# Patient Record
Sex: Female | Born: 1960 | Race: White | Hispanic: No | Marital: Married | Smoking: Current every day smoker
Health system: Southern US, Community
[De-identification: ages and names within clinical notes are randomized; demographics above are authoritative.]

## PROBLEM LIST (undated history)

## (undated) DIAGNOSIS — I10 Essential (primary) hypertension: Secondary | ICD-10-CM

## (undated) HISTORY — PX: BREAST SURGERY: SHX581

---

## 2007-02-16 ENCOUNTER — Ambulatory Visit: Payer: Self-pay | Admitting: Family Medicine

## 2014-11-04 ENCOUNTER — Emergency Department (HOSPITAL_COMMUNITY): Payer: Self-pay

## 2014-11-04 ENCOUNTER — Encounter (HOSPITAL_COMMUNITY): Payer: Self-pay | Admitting: Emergency Medicine

## 2014-11-04 ENCOUNTER — Emergency Department (HOSPITAL_COMMUNITY)
Admission: EM | Admit: 2014-11-04 | Discharge: 2014-11-04 | Disposition: A | Discharge status: Home / Self Care | Payer: Self-pay | Attending: Emergency Medicine | Admitting: Emergency Medicine

## 2014-11-04 DIAGNOSIS — Z720 Tobacco use: Secondary | ICD-10-CM | POA: Insufficient documentation

## 2014-11-04 DIAGNOSIS — S93401A Sprain of unspecified ligament of right ankle, initial encounter: Secondary | ICD-10-CM | POA: Insufficient documentation

## 2014-11-04 DIAGNOSIS — Y998 Other external cause status: Secondary | ICD-10-CM | POA: Insufficient documentation

## 2014-11-04 DIAGNOSIS — Y9289 Other specified places as the place of occurrence of the external cause: Secondary | ICD-10-CM | POA: Insufficient documentation

## 2014-11-04 DIAGNOSIS — Y9389 Activity, other specified: Secondary | ICD-10-CM | POA: Insufficient documentation

## 2014-11-04 DIAGNOSIS — W010XXA Fall on same level from slipping, tripping and stumbling without subsequent striking against object, initial encounter: Secondary | ICD-10-CM | POA: Insufficient documentation

## 2014-11-04 DIAGNOSIS — I10 Essential (primary) hypertension: Secondary | ICD-10-CM | POA: Insufficient documentation

## 2014-11-04 HISTORY — DX: Essential (primary) hypertension: I10

## 2014-11-04 MED ORDER — HYDROCODONE-ACETAMINOPHEN 5-325 MG PO TABS
2.0000 | ORAL_TABLET | Freq: Once | ORAL | Status: AC
  Administered 2014-11-04: 2 via ORAL
  Filled 2014-11-04: qty 2

## 2014-11-04 MED ORDER — HYDROCODONE-ACETAMINOPHEN 5-325 MG PO TABS: 1.0000 | ORAL_TABLET | ORAL | Status: DC | PRN

## 2014-11-04 MED ORDER — IBUPROFEN 800 MG PO TABS: 800.0000 mg | ORAL_TABLET | Freq: Three times a day (TID) | ORAL | Status: DC

## 2014-11-04 NOTE — ED Notes (Signed)
Pt reports tripped down three stairs chasing after her cat. Pt reports right ankle and foot pain ever since. No obvious deformity noted. nad noted.

## 2014-11-04 NOTE — ED Provider Notes (Signed)
CSN: [id number]     Arrival date & time 11/04/14  1406 History   This chart was scribed for non-physician practitioner, Ivery QualeBryant Blessings Inglett, PA-C working with Raeford RazorStephen Kohut, MD by Luisa DagoPriscilla Tutu, ED scribe. This patient was seen in room APFT24/APFT24 and the patient's care was started at 4:39 PM.    Chief Complaint  Patient presents with  . Ankle Pain   The history is provided by the patient. No language interpreter was used.   HPI Comments: Deborah Hebert is a 53 y.o. female with PMhx of HTN presents to the Emergency Department complaining of right ankle pain with onset last night at [facility]7PM. Pt states that she tripped down 3 stairs chasing after her cat. She thinks that she may have twisted it but she is not sure. She reports taking Ibuprofen with no significant relief. Denies any fever, chills, nausea, numbness, weakness, wounds, taking any blood thinner, or hx of blood disorders.   Past Medical History  Diagnosis Date  . Hypertension    Past Surgical History  Procedure Laterality Date  . Breast surgery     History reviewed. No pertinent family history. History  Substance Use Topics  . Smoking status: Current Every Day Smoker -- 1.00 packs/day  . Smokeless tobacco: Not on file  . Alcohol Use: No   OB History    No data available     Review of Systems  Constitutional: Negative for fever and chills.  Musculoskeletal: Positive for joint swelling and arthralgias. Negative for myalgias.  Neurological: Negative for weakness and numbness.  All other systems reviewed and are negative.  Allergies  Review of patient's allergies indicates not on file.  Home Medications   Prior to Admission medications   Not on File   Triage vitals:BP 139/91 mmHg  Pulse 102  Temp(Src) 98.6 F (37 C) (Oral)  Resp 16  Ht 5\' 1"  (1.549 m)  Wt 126 lb (57.153 kg)  BMI 23.82 kg/m2  SpO2 100%  Physical Exam  Constitutional: She is oriented to person, place, and time. She appears well-developed and  well-nourished. No distress.  HENT:  Head: Normocephalic and atraumatic.  Eyes: Conjunctivae and EOM are normal. Right eye exhibits no discharge. Left eye exhibits no discharge.  Neck: Neck supple.  Cardiovascular: Normal rate, regular rhythm and normal heart sounds.  Exam reveals no gallop and no friction rub.   No murmur heard. Pulmonary/Chest: Effort normal and breath sounds normal. No respiratory distress. She has no wheezes. She has no rales. She exhibits no tenderness.  Symmetrical rise and fall of the chest   Abdominal: Soft. She exhibits no distension. There is no tenderness.  Musculoskeletal: Normal range of motion. She exhibits no edema or tenderness.  No deformity of the tibia area. Pain and moderate swelling of the lateral malleolus on the right.  Mild crepitus to the right knee but with FROM. FROM of the right hip.  Neurological: She is alert and oriented to person, place, and time.  No sensory deficits of the right lower extremity. No motor deficits.   Skin: Skin is warm and dry.  Moderate bruise noted lateral to the medial malleolus. No puncture wound to the sole of the right foot.  Psychiatric: She has a normal mood and affect. Her behavior is normal. Thought content normal.  Nursing note and vitals reviewed.   ED Course  Procedures (including critical care time)  DIAGNOSTIC STUDIES: Oxygen Saturation is 100% on RA, normal by my interpretation.    COORDINATION OF CARE: 4:46  PM- pt's imaging was reviewed and discussed with her. Advised pt to keep the foot elevated and will discharge pt home with some pain medication and crutches for comfort. Pt advised of plan for treatment and pt agrees.  Imaging Review Dg Ankle Complete Right  11/04/2014   CLINICAL DATA:  53 year old female with right ankle and foot pain and swelling after falling off a porch last night.  EXAM: RIGHT ANKLE - COMPLETE 3+ VIEW  COMPARISON:  Concurrently obtained radiographs of the right foot  FINDINGS:  Soft tissue swelling overlies the lateral malleolus. There is no evidence of acute fracture or malalignment. No ankle joint effusion. Bony mineralization is within normal limits. No lytic or blastic osseous lesion.  IMPRESSION: Soft tissue swelling over the lateral malleolus without evidence of underlying fracture or malalignment.   Electronically Signed   By: Malachy MoanHeath  McCullough M.D.   On: 11/04/2014 15:25   Dg Foot Complete Right  11/04/2014   CLINICAL DATA:  53 year old female with right ankle and foot pain after falling off reports last night.  EXAM: RIGHT FOOT COMPLETE - 3+ VIEW  COMPARISON:  Concurrently obtained radiographs of the ankle.  FINDINGS: There is no evidence of fracture or dislocation. There is no evidence of arthropathy or other focal bone abnormality. Soft tissues are unremarkable.  IMPRESSION: Negative.   Electronically Signed   By: Malachy MoanHeath  McCullough M.D.   On: 11/04/2014 15:26    MDM  xrays are negative for fx or dislocation. Exam suggest ankle sprain.  Plan - ortho follow up. norco and ibuprofen Rx given.   Final diagnoses:  Ankle sprain, right, initial encounter    **I have reviewed nursing notes, vital signs, and all appropriate lab and imaging results for this patient.*  *I personally performed the services described in this documentation, which was scribed in my presence. The recorded information has been reviewed and is accurate.   Kathie DikeHobson M Phaedra Colgate, PA-C 11/08/14 1359  Raeford RazorStephen Kohut, MD 11/11/14 [phone]

## 2014-11-04 NOTE — Discharge Instructions (Signed)
Please keep your ankle elevated above your waist. Apply ice. Use crutches until you can safely apply weight. Use ibuprofen three times daily. Use norco every 4 hours if needed for pain. Follow up with your primary MD. Ankle Sprain An ankle sprain is an injury to the strong, fibrous tissues (ligaments) that hold the bones of your ankle joint together.  CAUSES An ankle sprain is usually caused by a fall or by twisting your ankle. Ankle sprains most commonly occur when you step on the outer edge of your foot, and your ankle turns inward. People who participate in sports are more prone to these types of injuries.  SYMPTOMS   Pain in your ankle. The pain may be present at rest or only when you are trying to stand or walk.  Swelling.  Bruising. Bruising may develop immediately or within 1 to 2 days after your injury.  Difficulty standing or walking, particularly when turning corners or changing directions. DIAGNOSIS  Your caregiver will ask you details about your injury and perform a physical exam of your ankle to determine if you have an ankle sprain. During the physical exam, your caregiver will press on and apply pressure to specific areas of your foot and ankle. Your caregiver will try to move your ankle in certain ways. An X-ray exam may be done to be sure a bone was not broken or a ligament did not separate from one of the bones in your ankle (avulsion fracture).  TREATMENT  Certain types of braces can help stabilize your ankle. Your caregiver can make a recommendation for this. Your caregiver may recommend the use of medicine for pain. If your sprain is severe, your caregiver may refer you to a surgeon who helps to restore function to parts of your skeletal system (orthopedist) or a physical therapist. HOME CARE INSTRUCTIONS   Apply ice to your injury for 1-2 days or as directed by your caregiver. Applying ice helps to reduce inflammation and pain.  Put ice in a plastic bag.  Place a towel  between your skin and the bag.  Leave the ice on for 15-20 minutes at a time, every 2 hours while you are awake.  Only take over-the-counter or prescription medicines for pain, discomfort, or fever as directed by your caregiver.  Elevate your injured ankle above the level of your heart as much as possible for 2-3 days.  If your caregiver recommends crutches, use them as instructed. Gradually put weight on the affected ankle. Continue to use crutches or a cane until you can walk without feeling pain in your ankle.  If you have a plaster splint, wear the splint as directed by your caregiver. Do not rest it on anything harder than a pillow for the first 24 hours. Do not put weight on it. Do not get it wet. You may take it off to take a shower or bath.  You may have been given an elastic bandage to wear around your ankle to provide support. If the elastic bandage is too tight (you have numbness or tingling in your foot or your foot becomes cold and blue), adjust the bandage to make it comfortable.  If you have an air splint, you may blow more air into it or let air out to make it more comfortable. You may take your splint off at night and before taking a shower or bath. Wiggle your toes in the splint several times per day to decrease swelling. SEEK MEDICAL CARE IF:   You have  rapidly increasing bruising or swelling.  Your toes feel extremely cold or you lose feeling in your foot.  Your pain is not relieved with medicine. SEEK IMMEDIATE MEDICAL CARE IF:  Your toes are numb or blue.  You have severe pain that is increasing. MAKE SURE YOU:   Understand these instructions.  Will watch your condition.  Will get help right away if you are not doing well or get worse. Document Released: 10/25/2005 Document Revised: 07/19/2012 Document Reviewed: 11/06/2011 [facility] Patient Information 2015 [place], [place]. This information is not intended to replace advice given to you by your health care  provider. Make sure you discuss any questions you have with your health care provider.

## 2015-09-07 ENCOUNTER — Ambulatory Visit (INDEPENDENT_AMBULATORY_CARE_PROVIDER_SITE_OTHER): Payer: Self-pay

## 2015-09-07 ENCOUNTER — Emergency Department
Admission: EM | Admit: 2015-09-07 | Discharge: 2015-09-07 | Disposition: A | Discharge status: Home / Self Care | Payer: Self-pay | Attending: Emergency Medicine | Admitting: Emergency Medicine

## 2015-09-07 ENCOUNTER — Ambulatory Visit
Admission: EM | Admit: 2015-09-07 | Discharge: 2015-09-07 | Disposition: A | Discharge status: Other Acute Inpatient Hospital | Payer: Self-pay | Attending: Family Medicine | Admitting: Family Medicine

## 2015-09-07 ENCOUNTER — Encounter: Payer: Self-pay | Admitting: Emergency Medicine

## 2015-09-07 ENCOUNTER — Emergency Department: Payer: Self-pay

## 2015-09-07 DIAGNOSIS — Y9289 Other specified places as the place of occurrence of the external cause: Secondary | ICD-10-CM | POA: Insufficient documentation

## 2015-09-07 DIAGNOSIS — W07XXXA Fall from chair, initial encounter: Secondary | ICD-10-CM | POA: Insufficient documentation

## 2015-09-07 DIAGNOSIS — S52502A Unspecified fracture of the lower end of left radius, initial encounter for closed fracture: Secondary | ICD-10-CM

## 2015-09-07 DIAGNOSIS — Z791 Long term (current) use of non-steroidal anti-inflammatories (NSAID): Secondary | ICD-10-CM | POA: Insufficient documentation

## 2015-09-07 DIAGNOSIS — Z720 Tobacco use: Secondary | ICD-10-CM | POA: Insufficient documentation

## 2015-09-07 DIAGNOSIS — Z79899 Other long term (current) drug therapy: Secondary | ICD-10-CM | POA: Insufficient documentation

## 2015-09-07 DIAGNOSIS — Y998 Other external cause status: Secondary | ICD-10-CM | POA: Insufficient documentation

## 2015-09-07 DIAGNOSIS — I10 Essential (primary) hypertension: Secondary | ICD-10-CM | POA: Insufficient documentation

## 2015-09-07 DIAGNOSIS — S52532A Colles' fracture of left radius, initial encounter for closed fracture: Secondary | ICD-10-CM | POA: Insufficient documentation

## 2015-09-07 DIAGNOSIS — Y9389 Activity, other specified: Secondary | ICD-10-CM | POA: Insufficient documentation

## 2015-09-07 MED ORDER — DOCUSATE SODIUM 100 MG PO CAPS: ORAL_CAPSULE | ORAL | Status: DC

## 2015-09-07 MED ORDER — HYDROCODONE-ACETAMINOPHEN 5-325 MG PO TABS: 1.0000 | ORAL_TABLET | Freq: Four times a day (QID) | ORAL | Status: DC | PRN

## 2015-09-07 MED ORDER — LIDOCAINE HCL (PF) 1 % IJ SOLN
INTRAMUSCULAR | Status: AC
  Administered 2015-09-07: 16:00:00
  Filled 2015-09-07: qty 5

## 2015-09-07 MED ORDER — ONDANSETRON HCL 4 MG/2ML IJ SOLN
4.0000 mg | INTRAMUSCULAR | Status: AC
  Administered 2015-09-07: 4 mg via INTRAVENOUS
  Filled 2015-09-07: qty 2

## 2015-09-07 MED ORDER — KETOROLAC TROMETHAMINE 60 MG/2ML IM SOLN
60.0000 mg | Freq: Once | INTRAMUSCULAR | Status: AC
  Administered 2015-09-07: 60 mg via INTRAMUSCULAR

## 2015-09-07 MED ORDER — MORPHINE SULFATE (PF) 4 MG/ML IV SOLN
4.0000 mg | Freq: Once | INTRAVENOUS | Status: AC
  Administered 2015-09-07: 4 mg via INTRAVENOUS
  Filled 2015-09-07: qty 1

## 2015-09-07 MED ORDER — HYDROCODONE-ACETAMINOPHEN 5-325 MG PO TABS
1.0000 | ORAL_TABLET | Freq: Once | ORAL | Status: AC
  Administered 2015-09-07: 1 via ORAL

## 2015-09-07 MED ORDER — BUPIVACAINE HCL (PF) 0.5 % IJ SOLN
INTRAMUSCULAR | Status: AC
  Administered 2015-09-07: 16:00:00
  Filled 2015-09-07: qty 30

## 2015-09-07 NOTE — ED Provider Notes (Signed)
[facility] Emergency Department Provider Note  ____________________________________________  Time seen: Approximately 1:48 PM  I have reviewed the triage vital signs and the nursing notes.   HISTORY  Chief Complaint Wrist Pain    HPI Stashia Sia is a 54 y.o. female with a past medical history that includes hypertension and tobacco abuse who presents with pain and deformity in her left wrist.  She was sent over by the medical in urgent care for further evaluation given the severity of her fracture.  She had a mechanical fall last night where she fell off of a chair and sustained the full brunt of the fall on her left wrist.  She did not strike her head or lose consciousness,and she denies any head or neck pain.  She did not have a ride so she "tough it out" through the night and then went to the [facility] urgent care this morning where she had x-rays and was referred here.  She has not had any food or drink since about 8:00 this morning (approximately 6 hours ago).  She has sustained no other injuries.  She reports that the pain in her wrist is moderate at rest and severe with any sort of movement.  She denies numbness or tingling distal to the wound.  She is able to minimally wiggle her fingers but the movement does cause pain.   Past Medical History  Diagnosis Date  . Hypertension     There are no active problems to display for this patient.   Past Surgical History  Procedure Laterality Date  . Breast surgery      Current Outpatient Rx  Name  Route  Sig  Dispense  Refill  . Aspirin-Acetaminophen-Caffeine (GOODY HEADACHE PO)   Oral   Take 1 packet by mouth daily as needed (for pain).         Marland Kitchen docusate sodium (COLACE) 100 MG capsule      Take 1 tablet once or twice daily as needed for constipation while taking narcotic pain medicine   30 capsule   1   . hydrochlorothiazide (HYDRODIURIL) 12.5 MG tablet   Oral   Take 12.5 mg by mouth every  morning.         Marland Kitchen HYDROcodone-acetaminophen (NORCO/VICODIN) 5-325 MG tablet   Oral   Take 1-2 tablets by mouth every 6 (six) hours as needed for moderate pain.   40 tablet   0   . ibuprofen (ADVIL,MOTRIN) 800 MG tablet   Oral   Take 1 tablet (800 mg total) by mouth 3 (three) times daily.   21 tablet   0     Allergies Review of patient's allergies indicates no known allergies.  No family history on file.  Social History Social History  Substance Use Topics  . Smoking status: Current Every Day Smoker -- 1.00 packs/day  . Smokeless tobacco: None  . Alcohol Use: No    Review of Systems Constitutional: No fever/chills Cardiovascular: Denies chest pain. Respiratory: Denies shortness of breath. Gastrointestinal: No abdominal pain.  No nausea, no vomiting.  No diarrhea.  No constipation. Musculoskeletal: Moderate to severe pain with deformity and left wrist Skin: Negative for lacerations Neurological: Negative for headaches, focal weakness or numbness.   ____________________________________________   PHYSICAL EXAM:  VITAL SIGNS: ED Triage Vitals  Enc Vitals Group     BP 09/07/15 1209 176/106 mmHg     Pulse Rate 09/07/15 1209 67     Resp 09/07/15 1209 20  Temp 09/07/15 1209 98.5 F (36.9 C)     Temp src --      SpO2 09/07/15 1209 97 %     Weight 09/07/15 1209 147 lb (66.679 kg)     Height 09/07/15 1209 5' (1.524 m)     Head Cir --      Peak Flow --      Pain Score 09/07/15 1206 4     Pain Loc --      Pain Edu? --      Excl. in GC? --     Constitutional: Alert and oriented. Well appearing and in no acute distress. Eyes: Conjunctivae are normal. PERRL. EOMI. Head: Atraumatic. Mouth/Throat: Mucous membranes are moist.  Oropharynx non-erythematous. Cardiovascular: Normal rate, regular rhythm. Good peripheral circulation. Respiratory: Normal respiratory effort.  No retractions. Mild expiratory wheezing. Gastrointestinal: Soft and nontender.   Musculoskeletal: Deformity to left wrist consistent with a Colles' fracture.  Severe tenderness to palpation.  Able to move fingers.  Neurovascularly intact distal to injury. Neurologic:  Normal speech and language. No gross focal neurologic deficits are appreciated.  Skin:  Skin is warm, dry and intact. No rash noted. Psychiatric: Mood and affect are normal. Speech and behavior are normal.  ____________________________________________   LABS (all labs ordered are listed, but only abnormal results are displayed)  Labs Reviewed - No data to display ____________________________________________  EKG  Not indicated ____________________________________________  RADIOLOGY   Dg Wrist Complete Left  09/07/2015  CLINICAL DATA:  Post reduction left wrist fracture. EXAM: LEFT WRIST - COMPLETE 3+ VIEW COMPARISON:  Left wrist plain films from earlier same day. FINDINGS: There is significantly improved alignment at the distal radius fracture site status post reduction and casting. Ulnar-styloid process fracture again noted. IMPRESSION: Improved alignment status post reduction and casting. Electronically Signed   By: Bary Richard M.D.   On: 09/07/2015 16:19   Dg Wrist Complete Left  09/07/2015  CLINICAL DATA:  Fall from chair, pain and swelling left radial side of wrist. EXAM: LEFT WRIST - COMPLETE 3+ VIEW COMPARISON:  None. FINDINGS: There is a displaced fracture of the distal left radius, Colles' fracture deformity, with loss of the normal volar relationship at the radiocarpal joint space. At least some degree of impaction at the fracture site. Additional irregularity within the distal left humerus compatible with an old ulnar styloid fracture. Carpal bones appear intact and well aligned throughout. Metacarpal bones appear intact and well aligned. IMPRESSION: Displaced fracture of the distal left radius, Colles' fracture, with associated dorsal angulation deformity and probable impaction.  Electronically Signed   By: Bary Richard M.D.   On: 09/07/2015 10:56   Dg Hand Complete Left  09/07/2015  CLINICAL DATA:  Fall, wrist pain/swelling EXAM: LEFT HAND - COMPLETE 3+ VIEW COMPARISON:  None. FINDINGS: Comminuted, impacted, intra-articular distal radial fracture. Apex volar angulation. Nondisplaced ulnar styloid fracture. Associated soft tissue swelling. IMPRESSION: Impacted distal radial fracture, as above. Ulnar styloid fracture. Electronically Signed   By: Charline Bills M.D.   On: 09/07/2015 10:53    ____________________________________________   PROCEDURES  Procedure(s) performed: None  Critical Care performed: No ____________________________________________   INITIAL IMPRESSION / ASSESSMENT AND PLAN / ED COURSE  Pertinent labs & imaging results that were available during my care of the patient were reviewed by me and considered in my medical decision making (see chart for details).  I spoke by phone with Dr. Joice Lofts who reviewed the images and states that the patient does need some reduction of  the fracture.  He is coming to the emergency department to personally evaluate and reduce the patient's injury.  I will place a peripheral IV, ask the appropriate reduction/casting materials to be put into the room, and provided morphine and Zofran.  Current plan is for Dr. Joice LoftsPoggi to perform a hematoma block and reduce it without procedural sedation, but we will escalate to that if necessary.  ----------------------------------------- 4:21 PM on 09/07/2015 -----------------------------------------  Dr. Joice LoftsPoggi came to the emergency department, performed the reduction and splinting himself, and discussed the case with him afterwards.  I also reassessed the patient's neurovascular status after placement of stents and everything appears well.  As per his recommendation I provided a prescription for pain medication and I also added a prescription for Colace.  I gave her my usual and  customary return precautions.  She will follow up with Dr. Joice LoftsPoggi in clinic in 3 days.  ____________________________________________  FINAL CLINICAL IMPRESSION(S) / ED DIAGNOSES  Final diagnoses:  Closed Colles' fracture of left radius, initial encounter      NEW MEDICATIONS STARTED DURING THIS VISIT:  New Prescriptions   DOCUSATE SODIUM (COLACE) 100 MG CAPSULE    Take 1 tablet once or twice daily as needed for constipation while taking narcotic pain medicine   HYDROCODONE-ACETAMINOPHEN (NORCO/VICODIN) 5-325 MG TABLET    Take 1-2 tablets by mouth every 6 (six) hours as needed for moderate pain.     Loleta Roseory Breauna Mazzeo, MD 09/07/15 [phone]

## 2015-09-07 NOTE — ED Notes (Signed)
Patient c/o pain in her left hand, wrist and forearm after falling off a chair that she was standing on at her home last night.

## 2015-09-07 NOTE — ED Provider Notes (Signed)
CSN: [id number]     Arrival date & time 09/07/15  0941 History   First MD Initiated Contact with Patient 09/07/15 1015     Chief Complaint  Patient presents with  . Hand Pain  . Wrist Pain  . Fall   (Consider location/radiation/quality/duration/timing/severity/associated sxs/prior Treatment) HPI Comments: 54 yo female presents with a left hand and wrist pain and swelling since last night after falling from a chair, 2-3 feet up, and landing on the floor. Does not recall how hand was positioned when she landed. Denies any numbness/tingling.   Patient is a 54 y.o. female presenting with hand pain, wrist pain, and fall. The history is provided by the patient.  Hand Pain  Wrist Pain  Fall    Past Medical History  Diagnosis Date  . Hypertension    Past Surgical History  Procedure Laterality Date  . Breast surgery     History reviewed. No pertinent family history. Social History  Substance Use Topics  . Smoking status: Current Every Day Smoker -- 1.00 packs/day  . Smokeless tobacco: None  . Alcohol Use: No   OB History    No data available     Review of Systems  Allergies  Review of patient's allergies indicates no known allergies.  Home Medications   Prior to Admission medications   Medication Sig Start Date End Date Taking? Authorizing Provider  Aspirin-Acetaminophen-Caffeine (GOODY HEADACHE PO) Take 1 packet by mouth daily as needed (for pain).    Historical Provider, MD  docusate sodium (COLACE) 100 MG capsule Take 1 tablet once or twice daily as needed for constipation while taking narcotic pain medicine 09/07/15   Loleta Roseory Forbach, MD  hydrochlorothiazide (HYDRODIURIL) 12.5 MG tablet Take 12.5 mg by mouth every morning.    Historical Provider, MD  HYDROcodone-acetaminophen (NORCO/VICODIN) 5-325 MG tablet Take 1-2 tablets by mouth every 6 (six) hours as needed for moderate pain. 09/07/15   Loleta Roseory Forbach, MD  ibuprofen (ADVIL,MOTRIN) 800 MG tablet Take 1 tablet (800 mg  total) by mouth 3 (three) times daily. 11/04/14   Ivery QualeHobson Bryant, PA-C   Meds Ordered and Administered this Visit   Medications  ketorolac (TORADOL) injection 60 mg (60 mg Intramuscular Given 09/07/15 1028)  HYDROcodone-acetaminophen (NORCO/VICODIN) 5-325 MG per tablet 1 tablet (1 tablet Oral Given 09/07/15 1027)    BP 165/94 mmHg  Pulse 60  Temp(Src) 98.5 F (36.9 C) (Tympanic)  Resp 16  Ht 5' (1.524 m)  Wt 147 lb (66.679 kg)  BMI 28.71 kg/m2  SpO2 98%  LMP  No data found.   Physical Exam  Musculoskeletal:       Left wrist: She exhibits decreased range of motion, tenderness, bony tenderness, swelling and deformity. She exhibits no effusion, no crepitus and no laceration.       Left hand: She exhibits bony tenderness (over the mid carpal bones) and swelling. She exhibits normal range of motion, no tenderness, normal two-point discrimination, normal capillary refill, no deformity and no laceration. Normal sensation noted. Decreased strength noted. She exhibits wrist extension trouble.       Hands: Nursing note and vitals reviewed.   ED Course  Procedures (including critical care time)  Labs Review Labs Reviewed - No data to display  Imaging Review No results found.   Visual Acuity Review  Right Eye Distance:   Left Eye Distance:   Bilateral Distance:    Right Eye Near:   Left Eye Near:    Bilateral Near:  MDM   1. Traumatic closed displaced fracture of distal end of left radius, initial encounter    Discharge Medication List as of 09/07/2015 11:56 AM    1. x-ray results and diagnosis reviewed with patient 2. Recommend patient go to ED for possible closed reduction attempt due to displacement; patient verbalized understanding and will go to [facility] ED; triage ED RN at [facility] notified  Payton Mccallum, MD 09/11/15 1228

## 2015-09-07 NOTE — ED Notes (Signed)
Ice pack applied to left hand and arm.

## 2015-09-07 NOTE — Consult Note (Signed)
ORTHOPAEDIC CONSULTATION  REQUESTING PHYSICIAN: Loleta Rose, MD  Chief Complaint:   Left wrist pain.  History of Present Illness: Deborah Hebert is a 54 y.o. female with a history of hypertension who apparently stepped up onto a chair to get a bowlout of a tall When she lost her balance and fell, landing on her outstretched left hand. This injury occurred last evening around 7 PM, but the patient elected not to seek treatment until this morning as she "did not want to wait". She went to the [facility] clinic where x-rays of the wrist were obtained. She was then referred to [facility] emergency room for fracture reduction. The patient denies any associated injuries and did not have any loss of consciousness. She denies any lightheadedness, dizziness, chest pain, or shortness of breath that may have precipitated her fall. She notes moderate pain in the wrist, but denies any numbness or paresthesias to her hand. She denies any prior injury to her left wrist.  Past Medical History  Diagnosis Date  . Hypertension    Past Surgical History  Procedure Laterality Date  . Breast surgery     Social History   Social History  . Marital Status: Married    Spouse Name: N/A  . Number of Children: N/A  . Years of Education: N/A   Social History Main Topics  . Smoking status: Current Every Day Smoker -- 1.00 packs/day  . Smokeless tobacco: None  . Alcohol Use: No  . Drug Use: No  . Sexual Activity: Not Asked   Other Topics Concern  . None   Social History Narrative   No family history on file. No Known Allergies Prior to Admission medications   Medication Sig Start Date End Date Taking? Authorizing Provider  Aspirin-Acetaminophen-Caffeine (GOODY HEADACHE PO) Take 1 packet by mouth daily as needed (for pain).    Historical Provider, MD  hydrochlorothiazide (HYDRODIURIL) 12.5 MG tablet Take 12.5  mg by mouth every morning.    Historical Provider, MD  HYDROcodone-acetaminophen (NORCO/VICODIN) 5-325 MG per tablet Take 1 tablet by mouth every 4 (four) hours as needed. 11/04/14   Ivery Quale, PA-C  ibuprofen (ADVIL,MOTRIN) 800 MG tablet Take 1 tablet (800 mg total) by mouth 3 (three) times daily. 11/04/14   Ivery Quale, PA-C   Dg Wrist Complete Left  09/07/2015  CLINICAL DATA:  Fall from chair, pain and swelling left radial side of wrist. EXAM: LEFT WRIST - COMPLETE 3+ VIEW COMPARISON:  None. FINDINGS: There is a displaced fracture of the distal left radius, Colles' fracture deformity, with loss of the normal volar relationship at the radiocarpal joint space. At least some degree of impaction at the fracture site. Additional irregularity within the distal left humerus compatible with an old ulnar styloid fracture. Carpal bones appear intact and well aligned throughout. Metacarpal bones appear intact and well aligned. IMPRESSION: Displaced fracture of the distal left radius, Colles' fracture, with associated dorsal angulation deformity and probable impaction. Electronically Signed   By: Bary Richard M.D.   On: 09/07/2015 10:56   Dg Hand Complete Left  09/07/2015  CLINICAL DATA:  Fall, wrist pain/swelling EXAM: LEFT HAND - COMPLETE 3+ VIEW COMPARISON:  None. FINDINGS: Comminuted, impacted, intra-articular distal radial fracture. Apex volar angulation. Nondisplaced ulnar styloid fracture. Associated soft tissue swelling. IMPRESSION: Impacted distal radial fracture, as above. Ulnar styloid fracture. Electronically Signed   By: Charline Bills M.D.   On: 09/07/2015 10:53    Positive ROS: All other systems have  been reviewed and were otherwise negative with the exception of those mentioned in the HPI and as above.  Physical Exam: General:  Alert, no acute distress Psychiatric:  Patient is competent for consent with normal mood and affect   Cardiovascular:  No pedal edema Respiratory:  No  wheezing, non-labored breathing GI:  Abdomen is soft and non-tender Skin:  No lesions in the area of chief complaint Neurologic:  Sensation intact distally Lymphatic:  No axillary or cervical lymphadenopathy  Orthopedic Exam:  Orthopedic examination is limited to the left forearm, wrist, and hand. Moderate swelling is noted around the wrist, but skin inspection otherwise is unremarkable as there is no ecchymosis, abrasions, lacerations, or erythema. There is a moderate silver spoon deformity noted over the distal radius. She has moderate tenderness to palpation over both the radial and ulnar aspects of the wrist, as well as dorsally. This pain is reproduced with any attempted active or passive motion of the wrist. She is able to actively flex and extend all digits. She has intact sensation to light touch over the median, ulnar, and radial nerve distributions. She has excellent capillary refill to all digits.  X-rays:  X-rays of the left distal radius are available for review. These films demonstrate a dorsally displaced and impacted extra-articular distal radius fracture with an ulnar styloid component. No significant degenerative changes are noted in the wrist and no lytic lesions are identified.  Assessment: Displaced left distal radius fracture with an ulnar styloid fracture.  Plan: The treatment options are discussed with the patient. After obtaining verbal consent, a hematoma block was placed sterilely using a total of 10 cc of 0.5% plain Sensorcaine and 5 cc of 1% plain lidocaine. A reduction was then performed using a combination of finger trap traction and manipulation before the patient was placed into a sugar tong splint, maintaining the wrist in slight flexion and ulnar deviation. The patient tolerated the procedure well. Postreduction films demonstrate significant improvement in her distal radius fracture re-alignment as the radial height and radial inclination is improved, but there is  still mild residual dorsal tilt.  The patient has been advised to keep the hand elevated above heart level at all times, and to keep the splint dry and intact. She may wiggle her fingers as much as possible. She is encouraged to take over-the-counter anti-inflammatory medications on a regular basis. In addition, a prescription for Norco has been provided for her to take on an as necessary basis. She is instructed to follow up in my office on Wednesday morning at the [facility] office in [place]. She will call tomorrow morning to make this appointment.  Thank you for ask me to participate in the care of this most pleasant woman. I will be happy to keep you abreast of her progress.   Maryagnes AmosJ. Jeffrey Claudius Mich, MD  Beeper #:  [phone]  09/07/2015 3:54 PM

## 2015-09-07 NOTE — ED Notes (Addendum)
Patient has left arm in sling.  Fell last night around 1900.  [facility].  Drank coffee around 0800 and has not had any food today.

## 2015-09-07 NOTE — ED Notes (Signed)
[facility] with left distal radial fracture , for possible reduction

## 2015-09-07 NOTE — Discharge Instructions (Addendum)
Keep left hand elevated above heart level at all times.  Apply ice to dorsum of splint as much as possible. Keep splint dry and intact. Take ibuprofen 800 mg 3 times daily with food to help with inflammation. Take pain medication as prescribed for more severe pain. Please arrange follow-up appointment on Wednesday morning at the [facility] office in [place]. Please call [phone] on Monday morning to get appointment time.   Forearm Fracture A forearm fracture is a break in one or both of the bones of your arm that are between the elbow and the wrist. Your forearm is made up of two bones:  Radius. This is the bone on the inside of your arm near your thumb.  Ulna. This is the bone on the outside of your arm near your little finger. Middle forearm fractures usually break both the radius and the ulna. Most forearm fractures that involve both the ulna and radius will require surgery. CAUSES Common causes of this type of fracture include:  Falling on an outstretched arm.  Accidents, such as a car or bike accident.  A hard, direct hit to the middle part of your arm. RISK FACTORS You may be at higher risk for this type of fracture if:  You play contact sports.  You have a condition that causes your bones to be weak or thin (osteoporosis). SIGNS AND SYMPTOMS A forearm fracture causes pain immediately after the injury. Other signs and symptoms include:  An abnormal bend or bump in your arm (deformity).  Swelling.  Numbness or tingling.  Tenderness.  Inability to turn your hand from side to side (rotate).  Bruising. DIAGNOSIS Your health care provider may diagnose a forearm fracture based on:  Your symptoms.  Your medical history, including any recent injury.  A physical exam. Your health care provider will look for any deformity and feel for tenderness over the break. Your health care provider will also check whether the bones are out of place.  An X-ray exam to  confirm the diagnosis and learn more about the type of fracture. TREATMENT The goals of treatment are to get the bone or bones in proper position for healing and to keep the bones from moving so they will heal over time. Your treatment will depend on many factors, especially the type of fracture that you have.  If the fractured bone or bones:  Are in the correct position (nondisplaced), you may only need to wear a cast or a splint.  Have a slightly displaced fracture, you may need to have the bones moved back into place manually (closed reduction) before the splint or cast is put on.  You may have a temporary splint before you have a cast. The splint allows room for some swelling. After a few days, a cast can replace the splint.  You may have to wear the cast for 6-8 weeks or as directed by your health care provider.  The cast may be changed after about 3 weeks or as directed by your health care provider.  After your cast is removed, you may need physical therapy to regain full movement in your wrist or elbow.  You may need emergency surgery if you have:  A fractured bone or bones that are out of position (displaced).  A fracture with multiple fragments (comminuted fracture).  A fracture that breaks the skin (open fracture). This type of fracture may require surgical wires, plates, or screws to hold the bone or bones in place.  You may  have X-rays every couple of weeks to check on your healing. HOME CARE INSTRUCTIONS If You Have a Cast:  Do not stick anything inside the cast to scratch your skin. Doing that increases your risk of infection.  Check the skin around the cast every day. Report any concerns to your health care provider. You may put lotion on dry skin around the edges of the cast. Do not apply lotion to the skin underneath the cast. If You Have a Splint:  Wear it as directed by your health care provider. Remove it only as directed by your health care provider.  Loosen  the splint if your fingers become numb and tingle, or if they turn cold and blue. Bathing  Cover the cast or splint with a watertight plastic bag to protect it from water while you bathe or shower. Do not let the cast or splint get wet. Managing Pain, Stiffness, and Swelling  If directed, apply ice to the injured area:  Put ice in a plastic bag.  Place a towel between your skin and the bag.  Leave the ice on for 20 minutes, 2-3 times a day.  Move your fingers often to avoid stiffness and to lessen swelling.  Raise the injured area above the level of your heart while you are sitting or lying down. Driving  Do not drive or operate heavy machinery while taking pain medicine.  Do not drive while wearing a cast or splint on a hand that you use for driving. Activity  Return to your normal activities as directed by your health care provider. Ask your health care provider what activities are safe for you.  Perform range-of-motion exercises only as directed by your health care provider. Safety  Do not use your injured limb to support your body weight until your health care provider says that you can. General Instructions  Do not put pressure on any part of the cast or splint until it is fully hardened. This may take several hours.  Keep the cast or splint clean and dry.  Do not use any tobacco products, including cigarettes, chewing tobacco, or electronic cigarettes. Tobacco can delay bone healing. If you need help quitting, ask your health care provider.  Take medicines only as directed by your health care provider.  Keep all follow-up visits as directed by your health care provider. This is important. SEEK MEDICAL CARE IF:  Your pain medicine is not helping.  Your cast or splint becomes wet or damaged or suddenly feels too tight.  Your cast becomes loose.  You have more severe pain or swelling than you did before the cast.  You have severe pain when you stretch your  fingers.  You continue to have pain or stiffness in your elbow or your wrist after your cast is removed. SEEK IMMEDIATE MEDICAL CARE IF:  You cannot move your fingers.  You lose feeling in your fingers or your hand.  Your hand or your fingers turn cold and pale or blue.  You notice a bad smell coming from your cast.  You have drainage from underneath your cast.  You have new stains from blood or drainage that is coming through your cast.   This information is not intended to replace advice given to you by your health care provider. Make sure you discuss any questions you have with your health care provider.   Document Released: 10/22/2000 Document Revised: 11/15/2014 Document Reviewed: 06/10/2014 Elsevier Interactive Patient Education 2016 Elsevier Inc.  Cast or Splint Care Casts  and splints support injured limbs and keep bones from moving while they heal. It is important to care for your cast or splint at home.  HOME CARE INSTRUCTIONS  Keep the cast or splint uncovered during the drying period. It can take 24 to 48 hours to dry if it is made of plaster. A fiberglass cast will dry in less than 1 hour.  Do not rest the cast on anything harder than a pillow for the first 24 hours.  Do not put weight on your injured limb or apply pressure to the cast until your health care provider gives you permission.  Keep the cast or splint dry. Wet casts or splints can lose their shape and may not support the limb as well. A wet cast that has lost its shape can also create harmful pressure on your skin when it dries. Also, wet skin can become infected.  Cover the cast or splint with a plastic bag when bathing or when out in the rain or snow. If the cast is on the trunk of the body, take sponge baths until the cast is removed.  If your cast does become wet, dry it with a towel or a blow dryer on the cool setting only.  Keep your cast or splint clean. Soiled casts may be wiped with a moistened  cloth.  Do not place any hard or soft foreign objects under your cast or splint, such as cotton, toilet paper, lotion, or powder.  Do not try to scratch the skin under the cast with any object. The object could get stuck inside the cast. Also, scratching could lead to an infection. If itching is a problem, use a blow dryer on a cool setting to relieve discomfort.  Do not trim or cut your cast or remove padding from inside of it.  Exercise all joints next to the injury that are not immobilized by the cast or splint. For example, if you have a long leg cast, exercise the hip joint and toes. If you have an arm cast or splint, exercise the shoulder, elbow, thumb, and fingers.  Elevate your injured arm or leg on 1 or 2 pillows for the first 1 to 3 days to decrease swelling and pain.It is best if you can comfortably elevate your cast so it is higher than your heart. SEEK MEDICAL CARE IF:   Your cast or splint cracks.  Your cast or splint is too tight or too loose.  You have unbearable itching inside the cast.  Your cast becomes wet or develops a soft spot or area.  You have a bad smell coming from inside your cast.  You get an object stuck under your cast.  Your skin around the cast becomes red or raw.  You have new pain or worsening pain after the cast has been applied. SEEK IMMEDIATE MEDICAL CARE IF:   You have fluid leaking through the cast.  You are unable to move your fingers or toes.  You have discolored (blue or white), cool, painful, or very swollen fingers or toes beyond the cast.  You have tingling or numbness around the injured area.  You have severe pain or pressure under the cast.  You have any difficulty with your breathing or have shortness of breath.  You have chest pain.   This information is not intended to replace advice given to you by your health care provider. Make sure you discuss any questions you have with your health care provider.   Document  Released: 10/22/2000 Document Revised: 08/15/2013 Document Reviewed: 05/03/2013 Elsevier Interactive Patient Education [employer] Elsevier Inc.

## 2015-09-08 DIAGNOSIS — S52532A Colles' fracture of left radius, initial encounter for closed fracture: Secondary | ICD-10-CM | POA: Insufficient documentation

## 2016-05-30 ENCOUNTER — Encounter: Payer: Self-pay | Admitting: Emergency Medicine

## 2016-05-30 ENCOUNTER — Emergency Department
Admission: EM | Admit: 2016-05-30 | Discharge: 2016-05-30 | Disposition: A | Discharge status: Home / Self Care | Payer: Medicaid Other | Attending: Emergency Medicine | Admitting: Emergency Medicine

## 2016-05-30 DIAGNOSIS — F17200 Nicotine dependence, unspecified, uncomplicated: Secondary | ICD-10-CM | POA: Diagnosis not present

## 2016-05-30 DIAGNOSIS — I10 Essential (primary) hypertension: Secondary | ICD-10-CM | POA: Diagnosis not present

## 2016-05-30 DIAGNOSIS — X58XXXA Exposure to other specified factors, initial encounter: Secondary | ICD-10-CM | POA: Insufficient documentation

## 2016-05-30 DIAGNOSIS — Z7982 Long term (current) use of aspirin: Secondary | ICD-10-CM | POA: Insufficient documentation

## 2016-05-30 DIAGNOSIS — S8391XA Sprain of unspecified site of right knee, initial encounter: Secondary | ICD-10-CM | POA: Diagnosis not present

## 2016-05-30 DIAGNOSIS — Y9311 Activity, swimming: Secondary | ICD-10-CM | POA: Diagnosis not present

## 2016-05-30 DIAGNOSIS — Y999 Unspecified external cause status: Secondary | ICD-10-CM | POA: Insufficient documentation

## 2016-05-30 DIAGNOSIS — Y929 Unspecified place or not applicable: Secondary | ICD-10-CM | POA: Diagnosis not present

## 2016-05-30 DIAGNOSIS — M25561 Pain in right knee: Secondary | ICD-10-CM | POA: Diagnosis present

## 2016-05-30 MED ORDER — NAPROXEN 500 MG PO TABS: 500.0000 mg | ORAL_TABLET | Freq: Two times a day (BID) | ORAL | 0 refills | Status: DC

## 2016-05-30 NOTE — ED Triage Notes (Signed)
Knee pain started yesterday when swimming

## 2016-05-30 NOTE — ED Provider Notes (Signed)
[facility] Emergency Department Provider Note  ____________________________________________  Time seen: Approximately 11:50 AM  I have reviewed the triage vital signs and the nursing notes.   HISTORY  Chief Complaint Knee Pain    HPI Deborah Hebert is a 55 y.o. female , NAD, presents to the emergency department with 2 day history of right knee pain. States she was attempting to get on a float and notes that her body went one direction and her knee the other. Had pain about the right lateral and posterior knee at the time. Has been wearing a knee brace which has seemed to help but notes that pain increases with weightbearing today. Does have a set of crutches at home but has not been using at this time. Has not noted any significant swelling. Has not had any bruising, redness, warmth or open wounds to the area. Denies any numbness, weakness, tingling. Denies head injury or LOC at the time of the incident.   Past Medical History:  Diagnosis Date  . Hypertension     There are no active problems to display for this patient.   Past Surgical History:  Procedure Laterality Date  . BREAST SURGERY      Current Outpatient Rx  . Order #: [id number] Class: Historical Med  . Order #: [id number] Class: Print  . Order #: [id number] Class: Historical Med  . Order #: [id number] Class: Print  . Order #: [id number] Class: Print  . Order #: [id number] Class: Print    Allergies Review of patient's allergies indicates no known allergies.  History reviewed. No pertinent family history.  Social History Social History  Substance Use Topics  . Smoking status: Current Every Day Smoker    Packs/day: 1.00  . Smokeless tobacco: Not on file  . Alcohol use No     Review of Systems  Constitutional: No fatigue Eyes: No visual changes.  Cardiovascular: No chest pain. Respiratory:  No shortness of breath.  Musculoskeletal: Positive right knee pain. Negative for back, hip,  ankle, foot pain.  Skin: Negative for rash him a redness, swelling, warmth, bruising, open wound or lacerations. Neurological: Negative for headaches, focal weakness or numbness. No head injury, dizziness, LOC, tingling 10-point ROS otherwise negative.  ____________________________________________   PHYSICAL EXAM:  VITAL SIGNS: ED Triage Vitals  Enc Vitals Group     BP      Pulse      Resp      Temp      Temp src      SpO2      Weight      Height      Head Circumference      Peak Flow      Pain Score      Pain Loc      Pain Edu?      Excl. in GC?      Constitutional: Alert and oriented. Well appearing and in no acute distress. Eyes: Conjunctivae are normal. Head: Atraumatic. Cardiovascular:  Good peripheral circulation with 2+ pulses noted in the right lower extremity. Respiratory: Normal respiratory effort without tachypnea or retractions.  Musculoskeletal: Full range of motion of right knee but with mild pain to full flexion. Negative patellofemoral grind. No masses or swelling noted to palpation of the posterior knee. No laxity with anterior or posterior drawer. No laxity with varus valgus stress. No lower extremity tenderness nor edema.  No joint effusions. Neurologic:  Normal speech and language. No gross focal neurologic deficits are appreciated. Gait with a slight limp favoring  the right. Her posture is normal. Sensation to light touch grossly intact about bilateral lower extremities Skin:  Skin is warm, dry and intact. No rash, redness, swelling, skin sores, lacerations, bruising noted. Psychiatric: Mood and affect are normal. Speech and behavior are normal. Patient exhibits appropriate insight and judgement.   ____________________________________________    LABS  None ____________________________________________  EKG  None ____________________________________________  RADIOLOGY  None ____________________________________________    PROCEDURES  Procedure(s) performed: None      Medications - No data to display   ____________________________________________   INITIAL IMPRESSION / ASSESSMENT AND PLAN / ED COURSE  Pertinent labs & imaging results that were available during my care of the patient were reviewed by me and considered in my medical decision making (see chart for details).  Patient's diagnosis is consistent with right knee sprain. Patient will be discharged home with prescriptions for naproxen to take as directed. Patient may continue to use right knee brace and was encouraged to use her crutches over the next couple of days to allow healing and to decrease pain by nonweightbearing. Patient is to follow up with Dr. Martha Clan in orthopedics if symptoms persist past this treatment course. Patient is given ED precautions to return to the ED for any worsening or new symptoms.    ____________________________________________  FINAL CLINICAL IMPRESSION(S) / ED DIAGNOSES  Final diagnoses:  Right knee sprain, initial encounter      NEW MEDICATIONS STARTED DURING THIS VISIT:  Discharge Medication List as of 05/30/2016 11:52 AM    START taking these medications   Details  naproxen (NAPROSYN) 500 MG tablet Take 1 tablet (500 mg total) by mouth 2 (two) times daily with a meal., Starting Sun 05/30/2016, Print             Ernestene Kiel Agra, PA-C 05/30/16 1309    Jeanmarie Plant, MD 05/30/16 1556

## 2016-05-30 NOTE — Discharge Instructions (Signed)
Please use the crutches you have at home for the first few days.

## 2016-09-24 ENCOUNTER — Other Ambulatory Visit: Payer: Self-pay | Admitting: Family Medicine

## 2016-09-24 DIAGNOSIS — Z1231 Encounter for screening mammogram for malignant neoplasm of breast: Secondary | ICD-10-CM

## 2016-09-24 DIAGNOSIS — Z01419 Encounter for gynecological examination (general) (routine) without abnormal findings: Secondary | ICD-10-CM

## 2016-11-24 IMAGING — CR DG HAND COMPLETE 3+V*L*
3 series · 3 of 3 positions shown · non-contrast
Comparison: None.

CLINICAL DATA: Fall, wrist pain/swelling

EXAM:
LEFT HAND - COMPLETE 3+ VIEW

[hand ap]
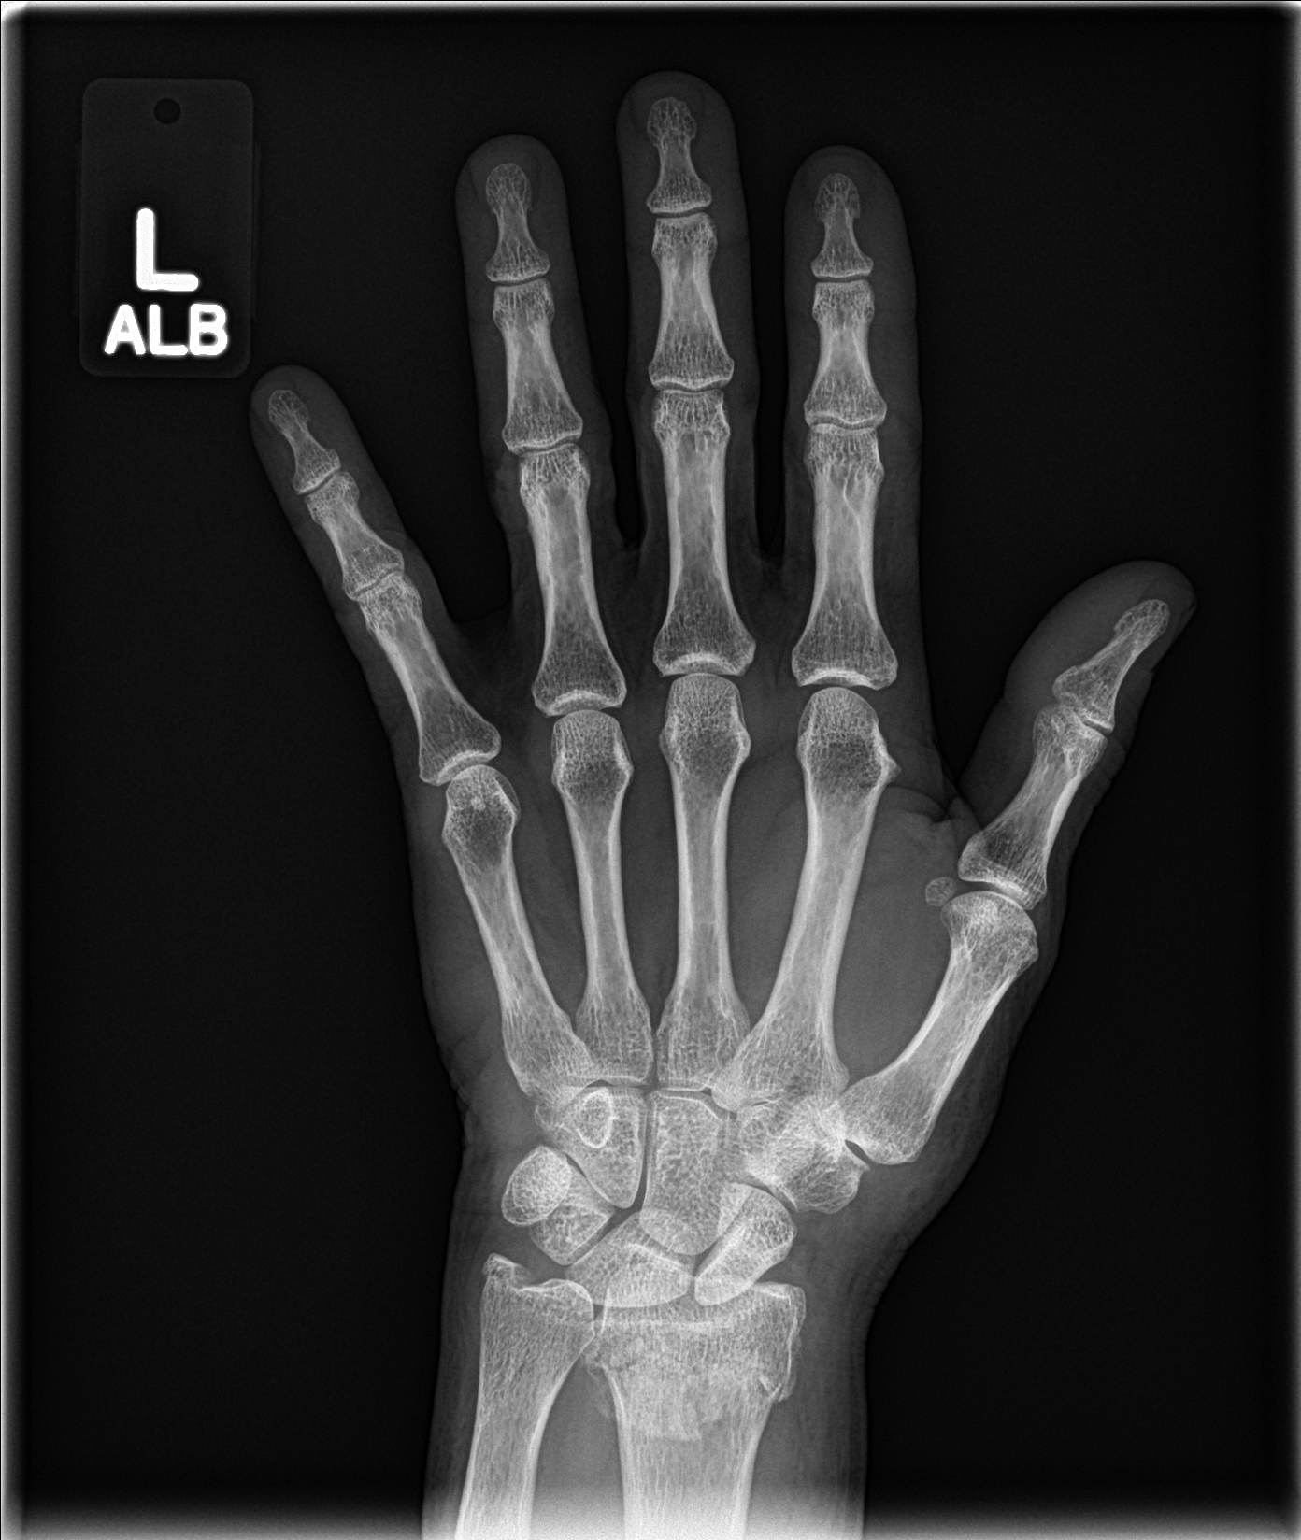

[hand obl]
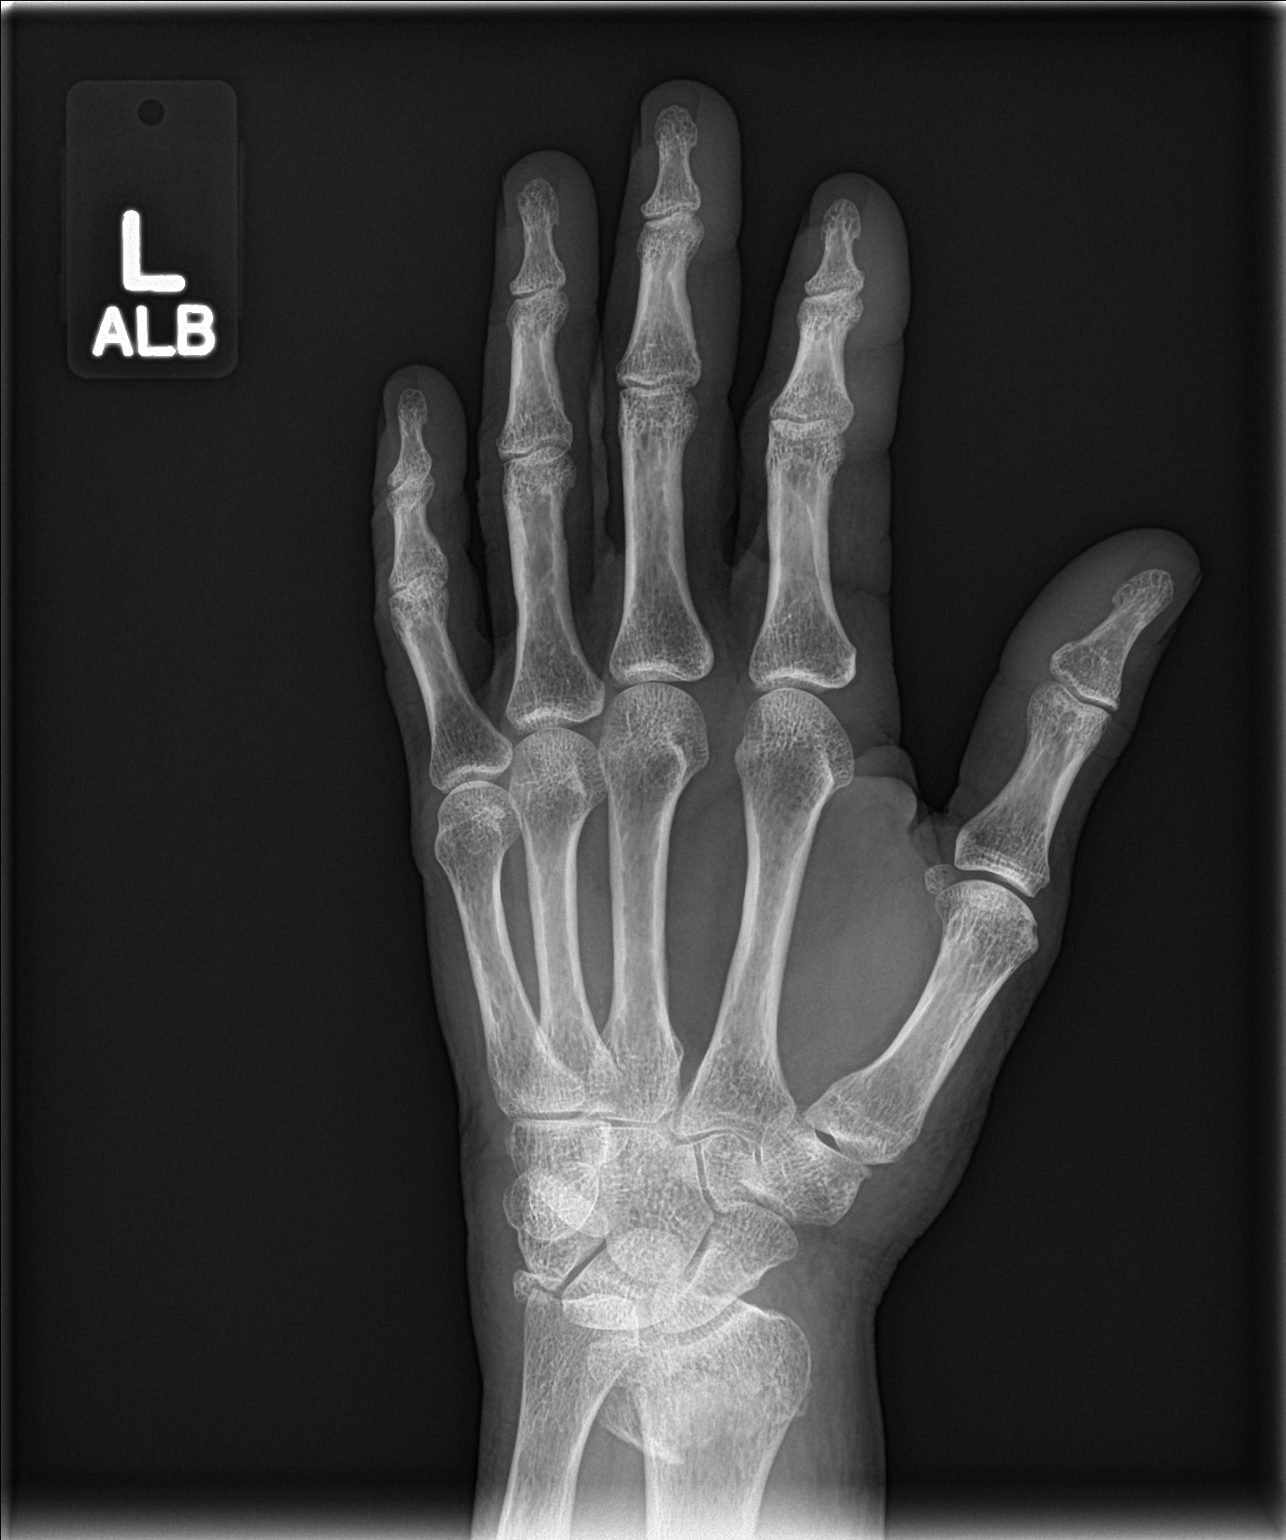

[hand lat]
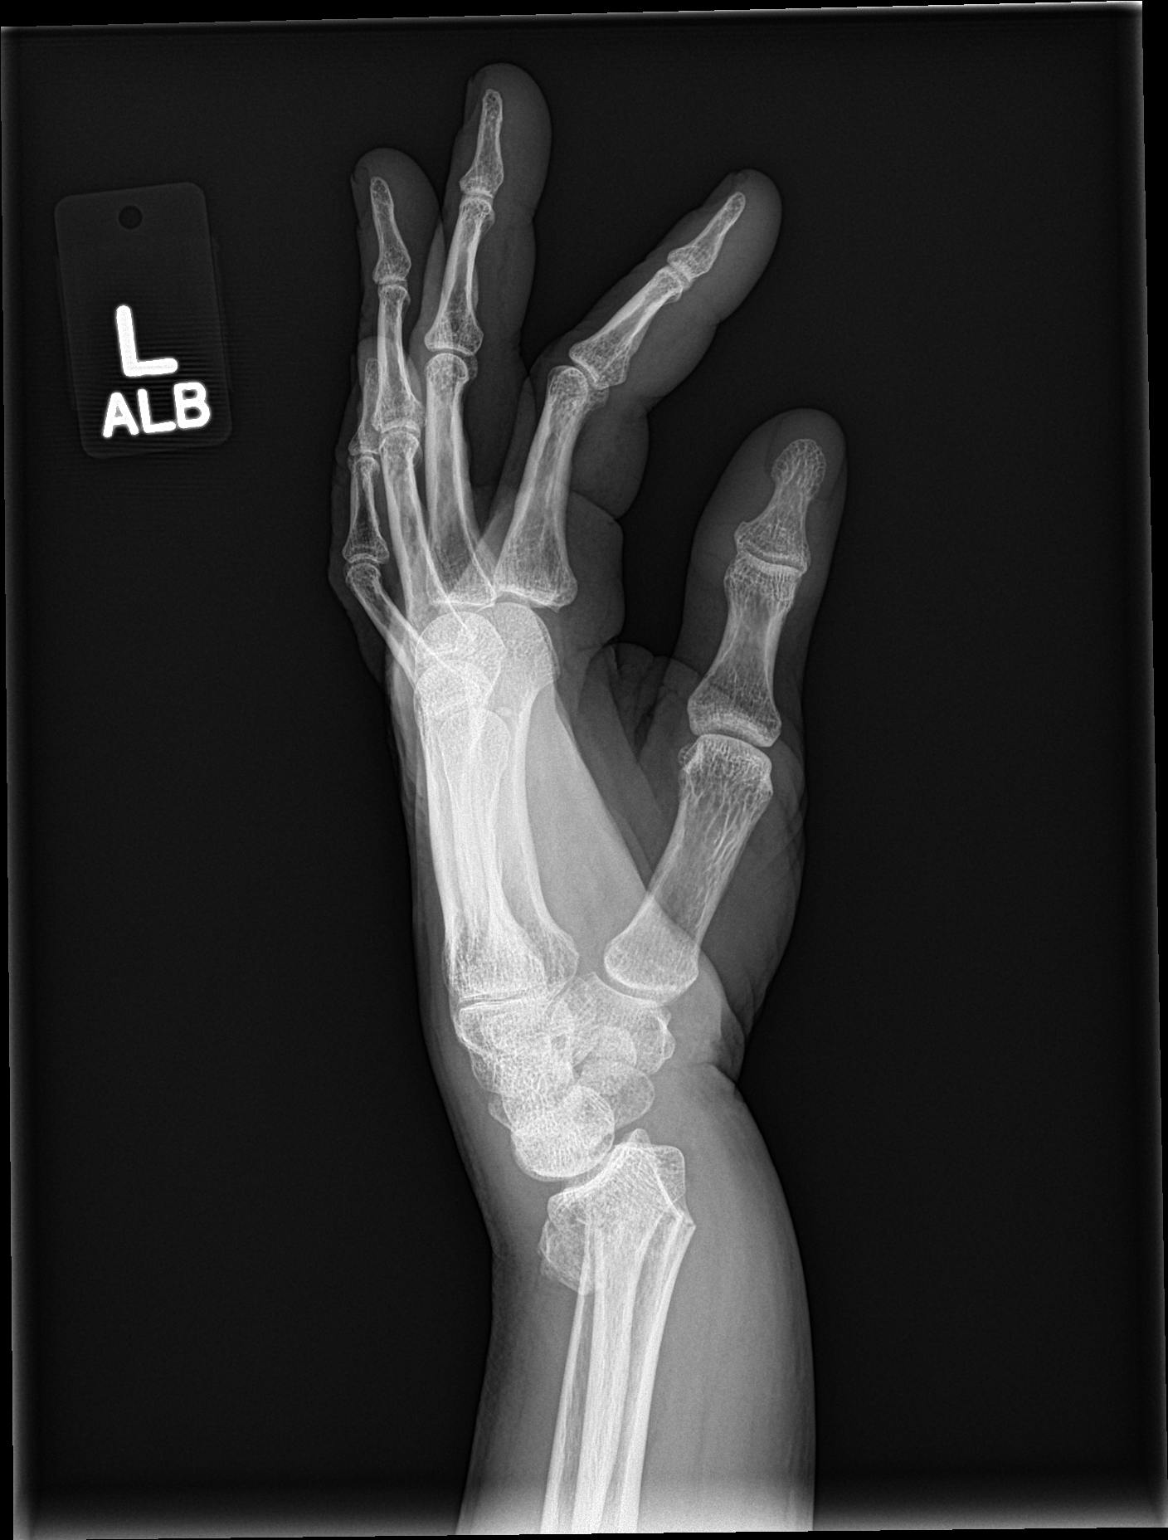

[3 of 3 positions shown; findings below may reference images not displayed]

FINDINGS: Comminuted, impacted, intra-articular distal radial fracture. Apex
volar angulation.

Nondisplaced ulnar styloid fracture.

Associated soft tissue swelling.
IMPRESSION: Impacted distal radial fracture, as above.

Ulnar styloid fracture.

## 2016-11-24 IMAGING — CR DG WRIST COMPLETE 3+V*L*
1 series · 3 of 3 positions shown · non-contrast
Comparison: Left wrist plain films from earlier same day.

CLINICAL DATA: Post reduction left wrist fracture.

EXAM:
LEFT WRIST - COMPLETE 3+ VIEW

[Series 1: pa · 0.17mm/px · 3 of 3 slices shown]
[im 1/3]
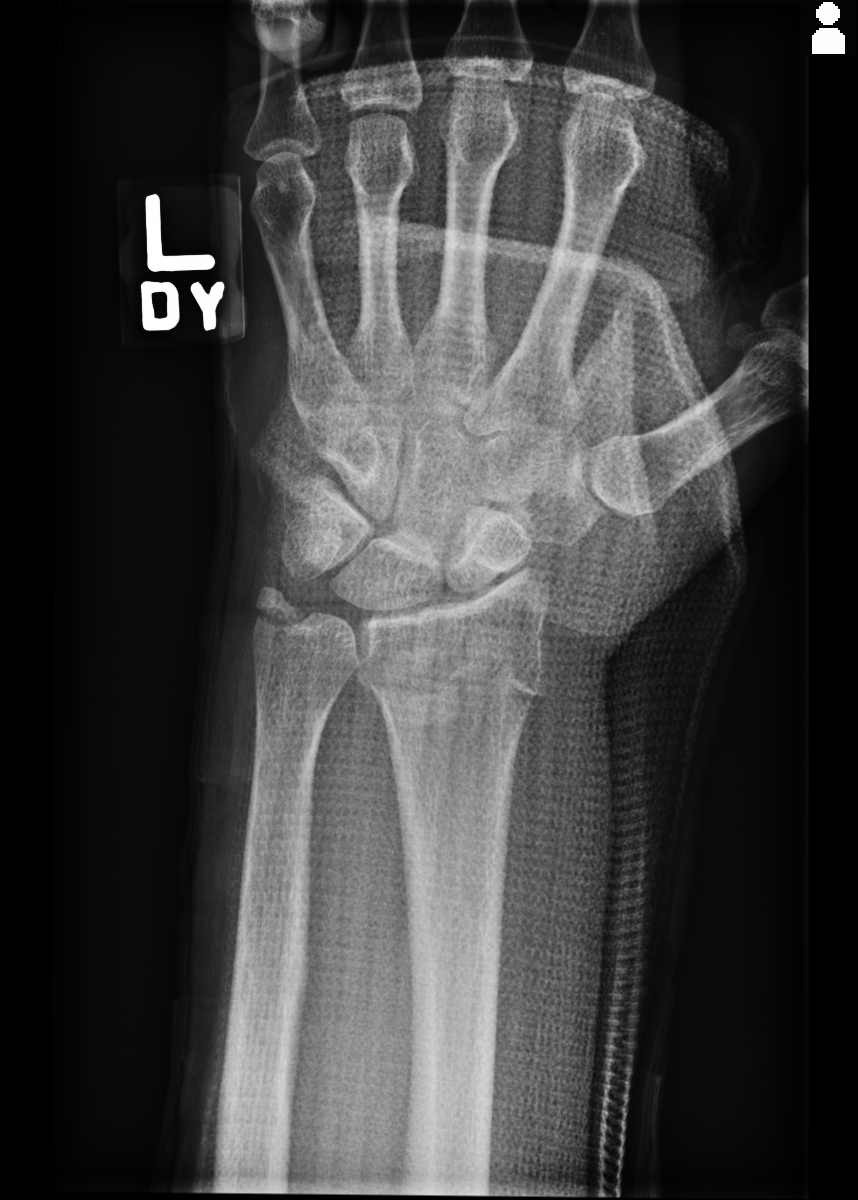
[im 2/3]
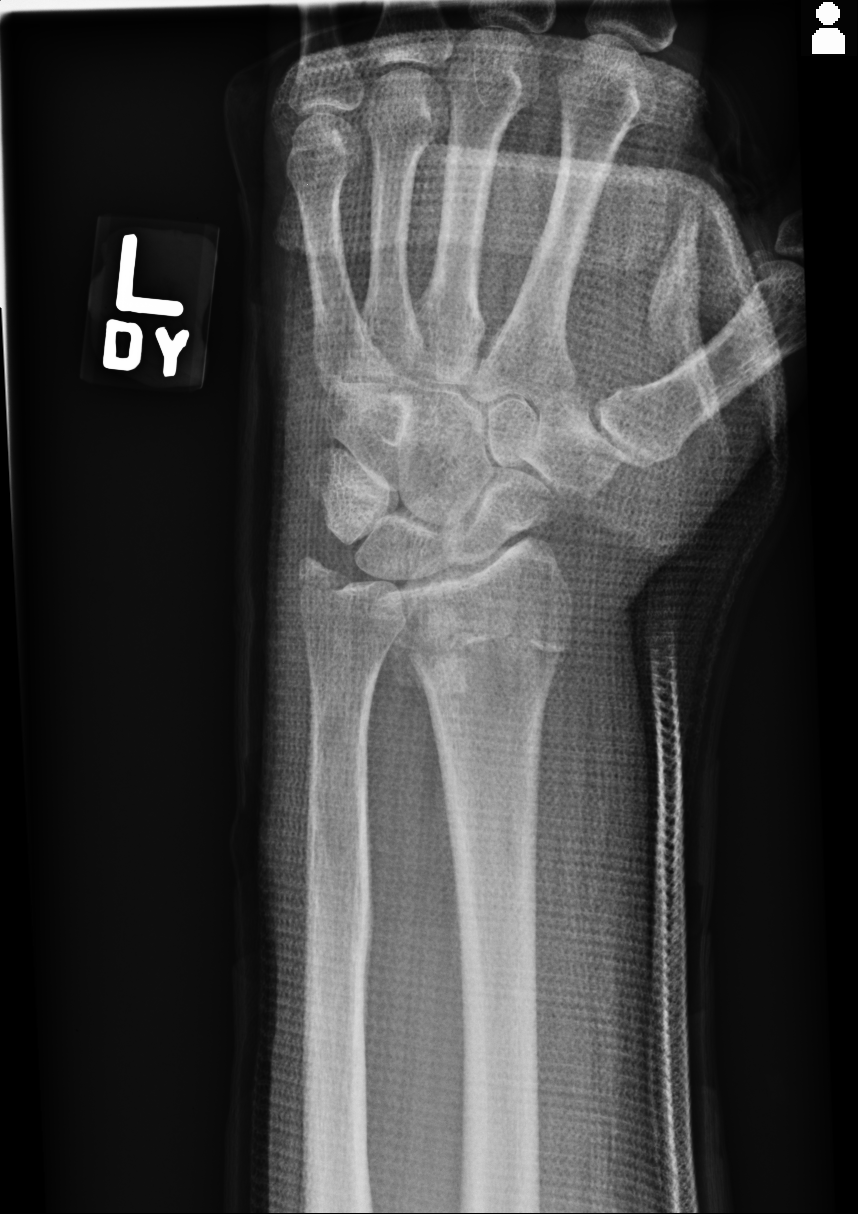
[im 3/3]
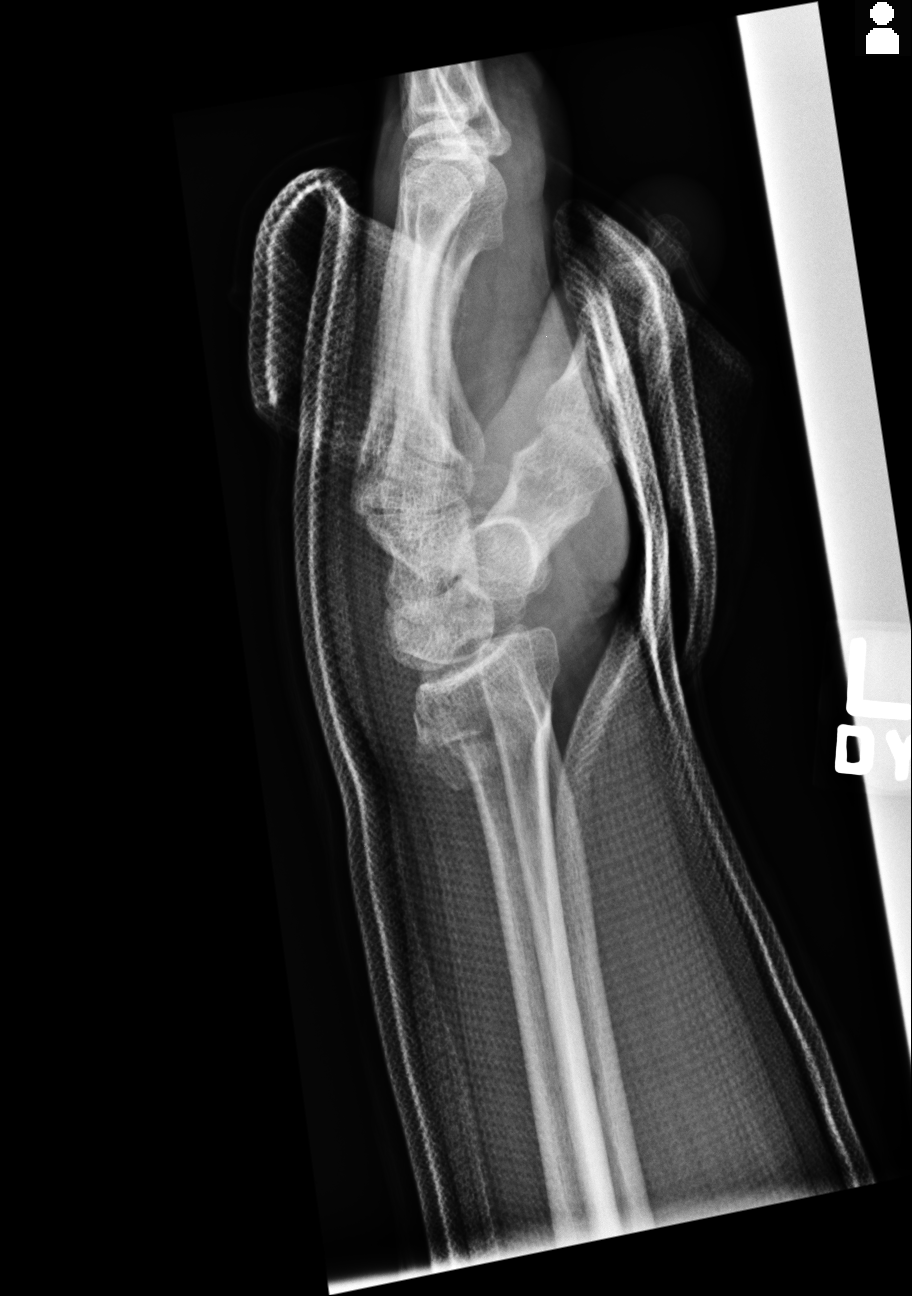

[3 of 3 positions shown; findings below may reference images not displayed]

FINDINGS: There is significantly improved alignment at the distal radius
fracture site status post reduction and casting. Ulnar-styloid
process fracture again noted.
IMPRESSION: Improved alignment status post reduction and casting.

## 2018-05-04 ENCOUNTER — Emergency Department
Admission: EM | Admit: 2018-05-04 | Discharge: 2018-05-04 | Disposition: A | Discharge status: Home / Self Care | Payer: Medicaid Other | Attending: Emergency Medicine | Admitting: Emergency Medicine

## 2018-05-04 ENCOUNTER — Encounter: Payer: Self-pay | Admitting: *Deleted

## 2018-05-04 ENCOUNTER — Emergency Department: Payer: Medicaid Other

## 2018-05-04 ENCOUNTER — Other Ambulatory Visit: Payer: Self-pay

## 2018-05-04 DIAGNOSIS — M545 Low back pain: Secondary | ICD-10-CM | POA: Insufficient documentation

## 2018-05-04 DIAGNOSIS — M546 Pain in thoracic spine: Secondary | ICD-10-CM | POA: Diagnosis not present

## 2018-05-04 DIAGNOSIS — W19XXXA Unspecified fall, initial encounter: Secondary | ICD-10-CM

## 2018-05-04 DIAGNOSIS — F17200 Nicotine dependence, unspecified, uncomplicated: Secondary | ICD-10-CM | POA: Insufficient documentation

## 2018-05-04 DIAGNOSIS — I10 Essential (primary) hypertension: Secondary | ICD-10-CM | POA: Insufficient documentation

## 2018-05-04 MED ORDER — KETOROLAC TROMETHAMINE 10 MG PO TABS: 10.0000 mg | ORAL_TABLET | Freq: Four times a day (QID) | ORAL | 0 refills | Status: AC | PRN

## 2018-05-04 MED ORDER — KETOROLAC TROMETHAMINE 30 MG/ML IJ SOLN
30.0000 mg | Freq: Once | INTRAMUSCULAR | Status: AC
  Administered 2018-05-04: 30 mg via INTRAMUSCULAR
  Filled 2018-05-04: qty 1

## 2018-05-04 MED ORDER — METHOCARBAMOL 500 MG PO TABS: 500.0000 mg | ORAL_TABLET | Freq: Three times a day (TID) | ORAL | 0 refills | Status: AC | PRN

## 2018-05-04 MED ORDER — METHOCARBAMOL 500 MG PO TABS
1000.0000 mg | ORAL_TABLET | Freq: Once | ORAL | Status: AC
  Administered 2018-05-04: 1000 mg via ORAL
  Filled 2018-05-04: qty 2

## 2018-05-04 NOTE — ED Provider Notes (Signed)
[facility] Emergency Department Provider Note  ____________________________________________  Time seen: Approximately 7:49 PM  I have reviewed the triage vital signs and the nursing notes.   HISTORY  Chief Complaint Back Pain    HPI Deborah Hebert is a 57 y.o. female presents to the emergency department with 10 out of 10 aching and sore low back pain and upper back pain after falling while getting out of the bathtub 4 days ago.  Patient did not hit her head or lose consciousness.  She denies neck pain.  Patient reports bruising to left upper arm and left axilla.  No weakness, radiculopathy or changes in sensation in the upper or lower extremities. She denies chest pain, chest tightness and shortness of breath. No alleviating measures have been attempted.    Past Medical History:  Diagnosis Date  . Hypertension     There are no active problems to display for this patient.   Past Surgical History:  Procedure Laterality Date  . BREAST SURGERY      Prior to Admission medications   Medication Sig Start Date End Date Taking? Authorizing Provider  Aspirin-Acetaminophen-Caffeine (GOODY HEADACHE PO) Take 1 packet by mouth daily as needed (for pain).    [provider]  docusate sodium (COLACE) 100 MG capsule Take 1 tablet once or twice daily as needed for constipation while taking narcotic pain medicine 09/07/15   Loleta RoseForbach, Cory, MD  hydrochlorothiazide (HYDRODIURIL) 12.5 MG tablet Take 12.5 mg by mouth every morning.    [provider]  HYDROcodone-acetaminophen (NORCO/VICODIN) 5-325 MG tablet Take 1-2 tablets by mouth every 6 (six) hours as needed for moderate pain. 09/07/15   Loleta RoseForbach, Cory, MD  ibuprofen (ADVIL,MOTRIN) 800 MG tablet Take 1 tablet (800 mg total) by mouth 3 (three) times daily. 11/04/14   Ivery QualeBryant, Hobson, PA-C  ketorolac (TORADOL) 10 MG tablet Take 1 tablet (10 mg total) by mouth every 6 (six) hours as needed for up to 5 days.  05/04/18 05/09/18  Orvil FeilWoods, Tyreke Kaeser M, PA-C  methocarbamol (ROBAXIN) 500 MG tablet Take 1 tablet (500 mg total) by mouth 3 (three) times daily as needed for up to 5 days for muscle spasms. 05/04/18 05/09/18  Orvil FeilWoods, Ellie Spickler M, PA-C  naproxen (NAPROSYN) 500 MG tablet Take 1 tablet (500 mg total) by mouth 2 (two) times daily with a meal. 05/30/16   Hagler, Jami L, PA-C    Allergies Patient has no known allergies.  No family history on file.  Social History Social History   Tobacco Use  . Smoking status: Current Every Day Smoker    Packs/day: 1.00  . Smokeless tobacco: Never Used  Substance Use Topics  . Alcohol use: No  . Drug use: No     Review of Systems  Constitutional: No fever/chills Eyes: No visual changes. No discharge ENT: No upper respiratory complaints. Cardiovascular: no chest pain. Respiratory: no cough. No SOB. Gastrointestinal: No abdominal pain.  No nausea, no vomiting.  No diarrhea.  No constipation. Genitourinary: Negative for dysuria. No hematuria Musculoskeletal: Patient has low back pain Skin: Patient has bruising.  Neurological: Negative for headaches, focal weakness or numbness.  ____________________________________________   PHYSICAL EXAM:  VITAL SIGNS: ED Triage Vitals  Enc Vitals Group     BP 05/04/18 1837 103/70     Pulse Rate 05/04/18 1837 75     Resp 05/04/18 1837 20     Temp 05/04/18 1837 98.7 F (37.1 C)     Temp Source 05/04/18 1837 Oral  SpO2 05/04/18 1837 95 %     Weight 05/04/18 1838 137 lb (62.1 kg)     Height 05/04/18 1838 5' (1.524 m)     Head Circumference --      Peak Flow --      Pain Score 05/04/18 1837 10     Pain Loc --      Pain Edu? --      Excl. in GC? --      Constitutional: Alert and oriented. Well appearing and in no acute distress. Eyes: Conjunctivae are normal. PERRL. EOMI. Head: Atraumatic. ENT:      Ears: TMs are pearly.      Nose: No congestion/rhinnorhea.      Mouth/Throat: Mucous membranes are moist.   Neck: No stridor.  No cervical spine tenderness to palpation. Cardiovascular: Normal rate, regular rhythm. Normal S1 and S2.  Good peripheral circulation. Respiratory: Normal respiratory effort without tachypnea or retractions. Lungs CTAB. Good air entry to the bases with no decreased or absent breath sounds. Gastrointestinal: Bowel sounds 4 quadrants. Soft and nontender to palpation. No guarding or rigidity. No palpable masses. No distention. No CVA tenderness. Musculoskeletal: Full range of motion to all extremities. No gross deformities appreciated.  Tenderness is elicited with palpation of the paraspinal muscles along the lumbar and thoracic spine. Neurologic:  Normal speech and language. No gross focal neurologic deficits are appreciated.  Skin:  Skin is warm, dry and intact. No rash noted. Psychiatric: Mood and affect are normal. Speech and behavior are normal. Patient exhibits appropriate insight and judgement.   ____________________________________________   LABS (all labs ordered are listed, but only abnormal results are displayed)  Labs Reviewed - No data to display ____________________________________________  EKG   ____________________________________________  RADIOLOGY I personally viewed and evaluated these images as part of my medical decision making, as well as reviewing the written report by the radiologist.    Dg Thoracic Spine 2 View  Result Date: 05/04/2018 CLINICAL DATA:  Fall 4 days ago in bathtub with back pain. EXAM: THORACIC SPINE 2 VIEWS COMPARISON:  None. FINDINGS: Vertebral body alignment is within normal. There is mild spondylosis throughout the thoracic spine. Pedicles are intact. There is subtle anterior wedging of a couple adjacent midthoracic vertebral bodies likely chronic. No evidence of subluxation. IMPRESSION: No acute findings. Mild spondylosis throughout the thoracic spine. Subtle anterior wedging of 2 adjacent mid vertebral bodies almost  certainly chronic. Electronically Signed   By: Elberta Fortis M.D.   On: 05/04/2018 20:31   Dg Lumbar Spine 2-3 Views  Result Date: 05/04/2018 CLINICAL DATA:  Fall 4 days ago in bathtub with back pain. EXAM: LUMBAR SPINE - 2-3 VIEW COMPARISON:  None. FINDINGS: Vertebral body alignment and heights are normal. There is mild spondylosis throughout the lumbar spine to include facet arthropathy over the lower lumbar spine. There is no evidence of compression fracture or spondylolisthesis. There is disc space narrowing at the L1-2,, L3-4 and L4-5 levels. There is calcified plaque over the distal abdominal aorta and iliac arteries. IMPRESSION: No acute findings. 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 Electronically Signed   By: Elberta Fortis M.D.   On: 05/04/2018 20:28    ____________________________________________    PROCEDURES  Procedure(s) performed:    Procedures    Medications  ketorolac (TORADOL) 30 MG/ML injection 30 mg (30 mg Intramuscular Given 05/04/18 1954)  methocarbamol (ROBAXIN) tablet 1,000 mg (1,000 mg Oral Given 05/04/18 1954)     ____________________________________________   INITIAL  IMPRESSION / ASSESSMENT AND PLAN / ED COURSE  Pertinent labs & imaging results that were available during my care of the patient were reviewed by me and considered in my medical decision making (see chart for details).  Review of the [place] CSRS was performed in accordance of the [facility] prior to dispensing any controlled drugs.    Assessment and Plan:  Fall:  Patient presents to the emergency department with low back pain and upper back pain after a fall that occurred 4 days ago.  Patient had paraspinal muscle tenderness along the lumbar and thoracic spine which improved significantly with Toradol and Robaxin given in the emergency department.  No acute fractures were identified on x-ray examination of the lumbar thoracic spine.  Patient was discharged with Toradol and  Robaxin after reassuring neurologic and overall physical exam.  All patient questions were answered.  ____________________________________________  FINAL CLINICAL IMPRESSION(S) / ED DIAGNOSES  Final diagnoses:  Fall, initial encounter      NEW MEDICATIONS STARTED DURING THIS VISIT:  ED Discharge Orders        Ordered    methocarbamol (ROBAXIN) 500 MG tablet  3 times daily PRN     05/04/18 2055    ketorolac (TORADOL) 10 MG tablet  Every 6 hours PRN     05/04/18 2055          This chart was dictated using voice recognition software/Dragon. Despite best efforts to proofread, errors can occur which can change the meaning. Any change was purely unintentional.    Orvil Feil, PA-C 05/04/18 2132    Nita Sickle, MD 05/06/18 [phone]

## 2018-05-04 NOTE — ED Triage Notes (Addendum)
Pt to triage via wheelchair. Pt fell 4 days ago in a bathtub.  Pt has back pain.  No loc.  Pt has bruising to left arm, left axilla  Pt alert.

## 2018-05-04 NOTE — ED Notes (Signed)
Pt taken to xray 

## 2018-10-26 ENCOUNTER — Other Ambulatory Visit: Payer: Self-pay | Admitting: Family Medicine

## 2018-10-26 DIAGNOSIS — Z1231 Encounter for screening mammogram for malignant neoplasm of breast: Secondary | ICD-10-CM

## 2018-11-21 ENCOUNTER — Ambulatory Visit: Payer: Medicaid Other | Admitting: Podiatry

## 2019-07-22 IMAGING — CR DG LUMBAR SPINE 2-3V
1 series · 3 of 3 positions shown · non-contrast
Comparison: None.

CLINICAL DATA: Fall 4 days ago in bathtub with back pain.

EXAM:
LUMBAR SPINE - 2-3 VIEW

[Series 1: dg lumbar spine 2-3 views · 0.14mm/px · 3 of 3 slices shown]
[im 1/3]
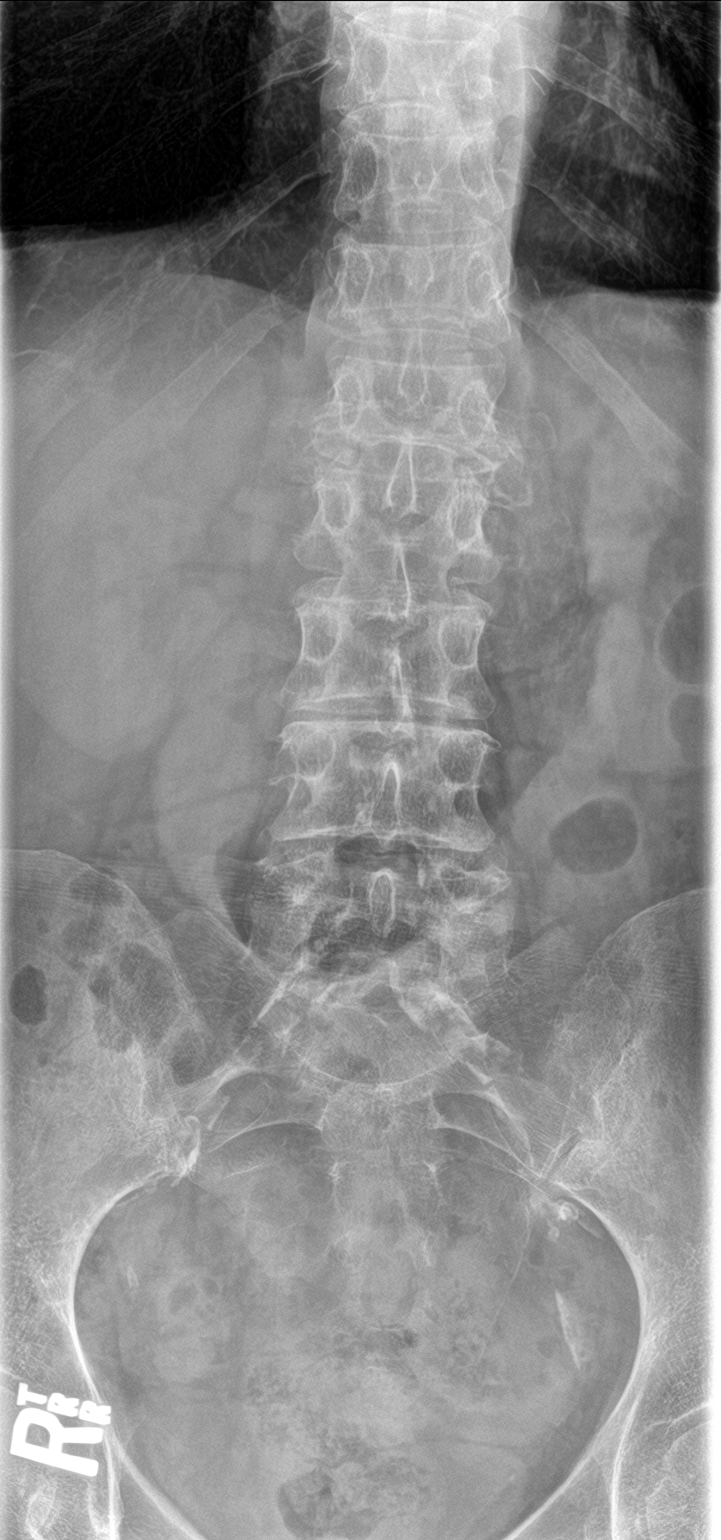
[im 2/3]
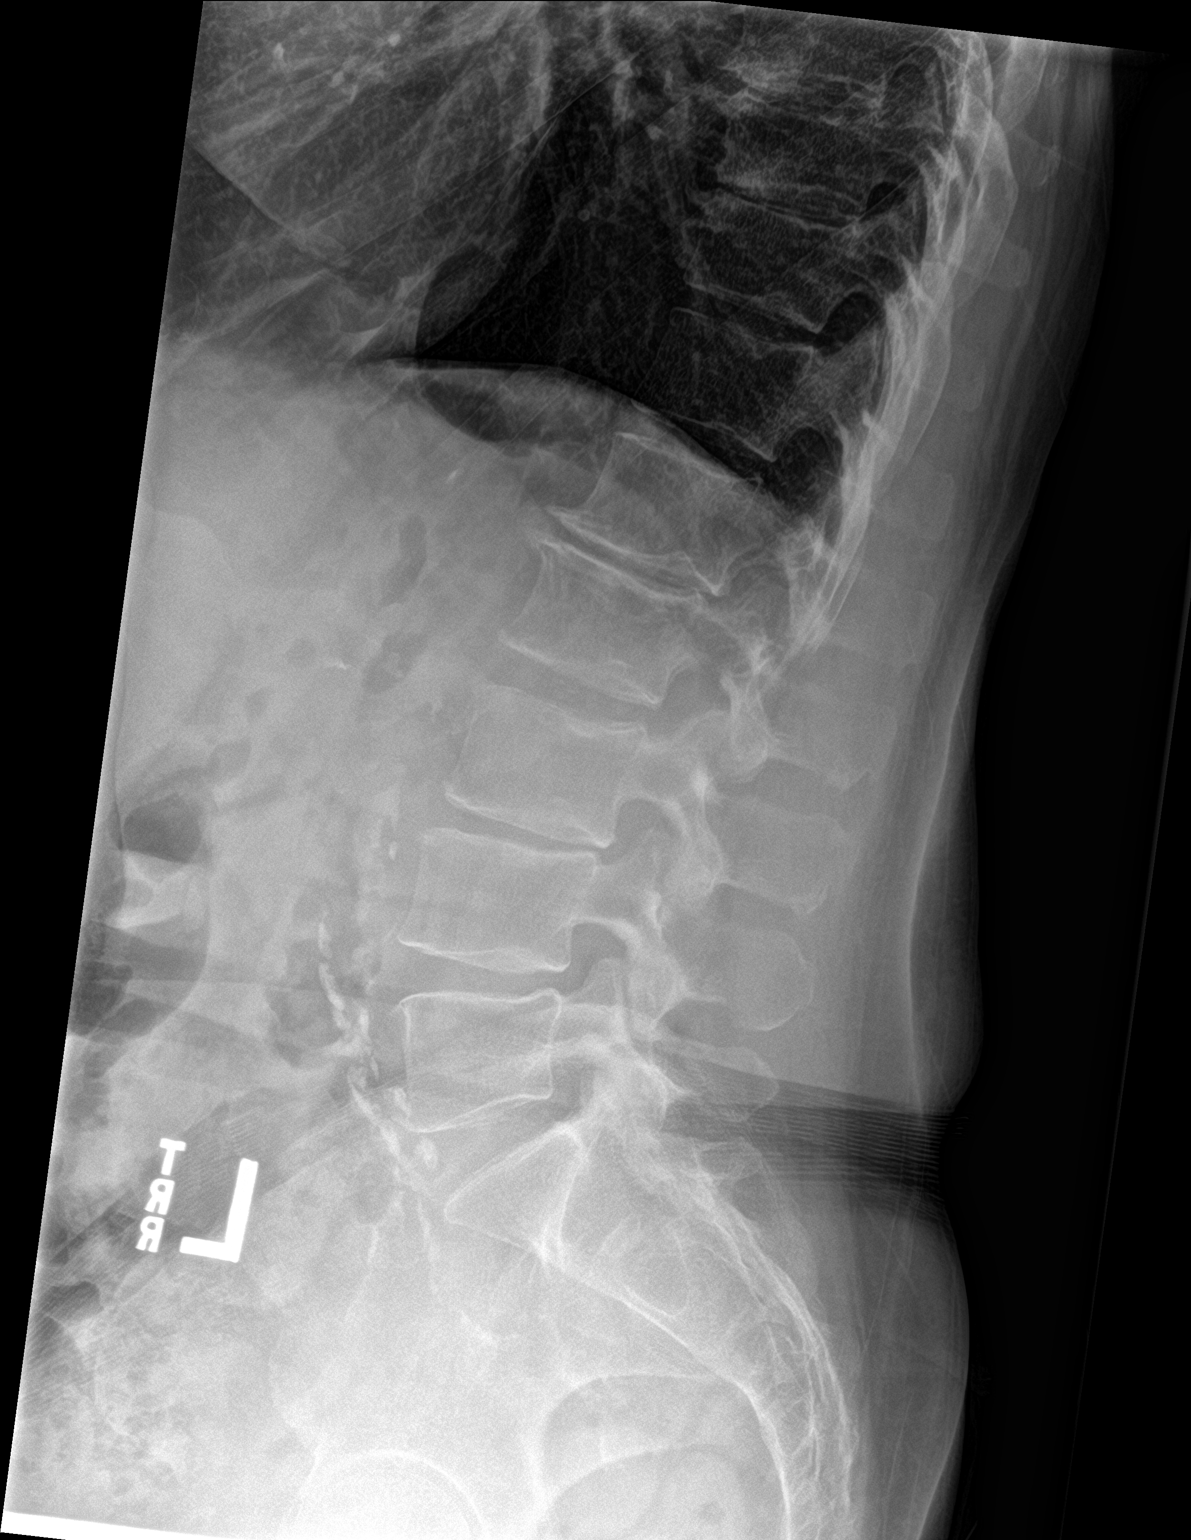
[im 3/3]
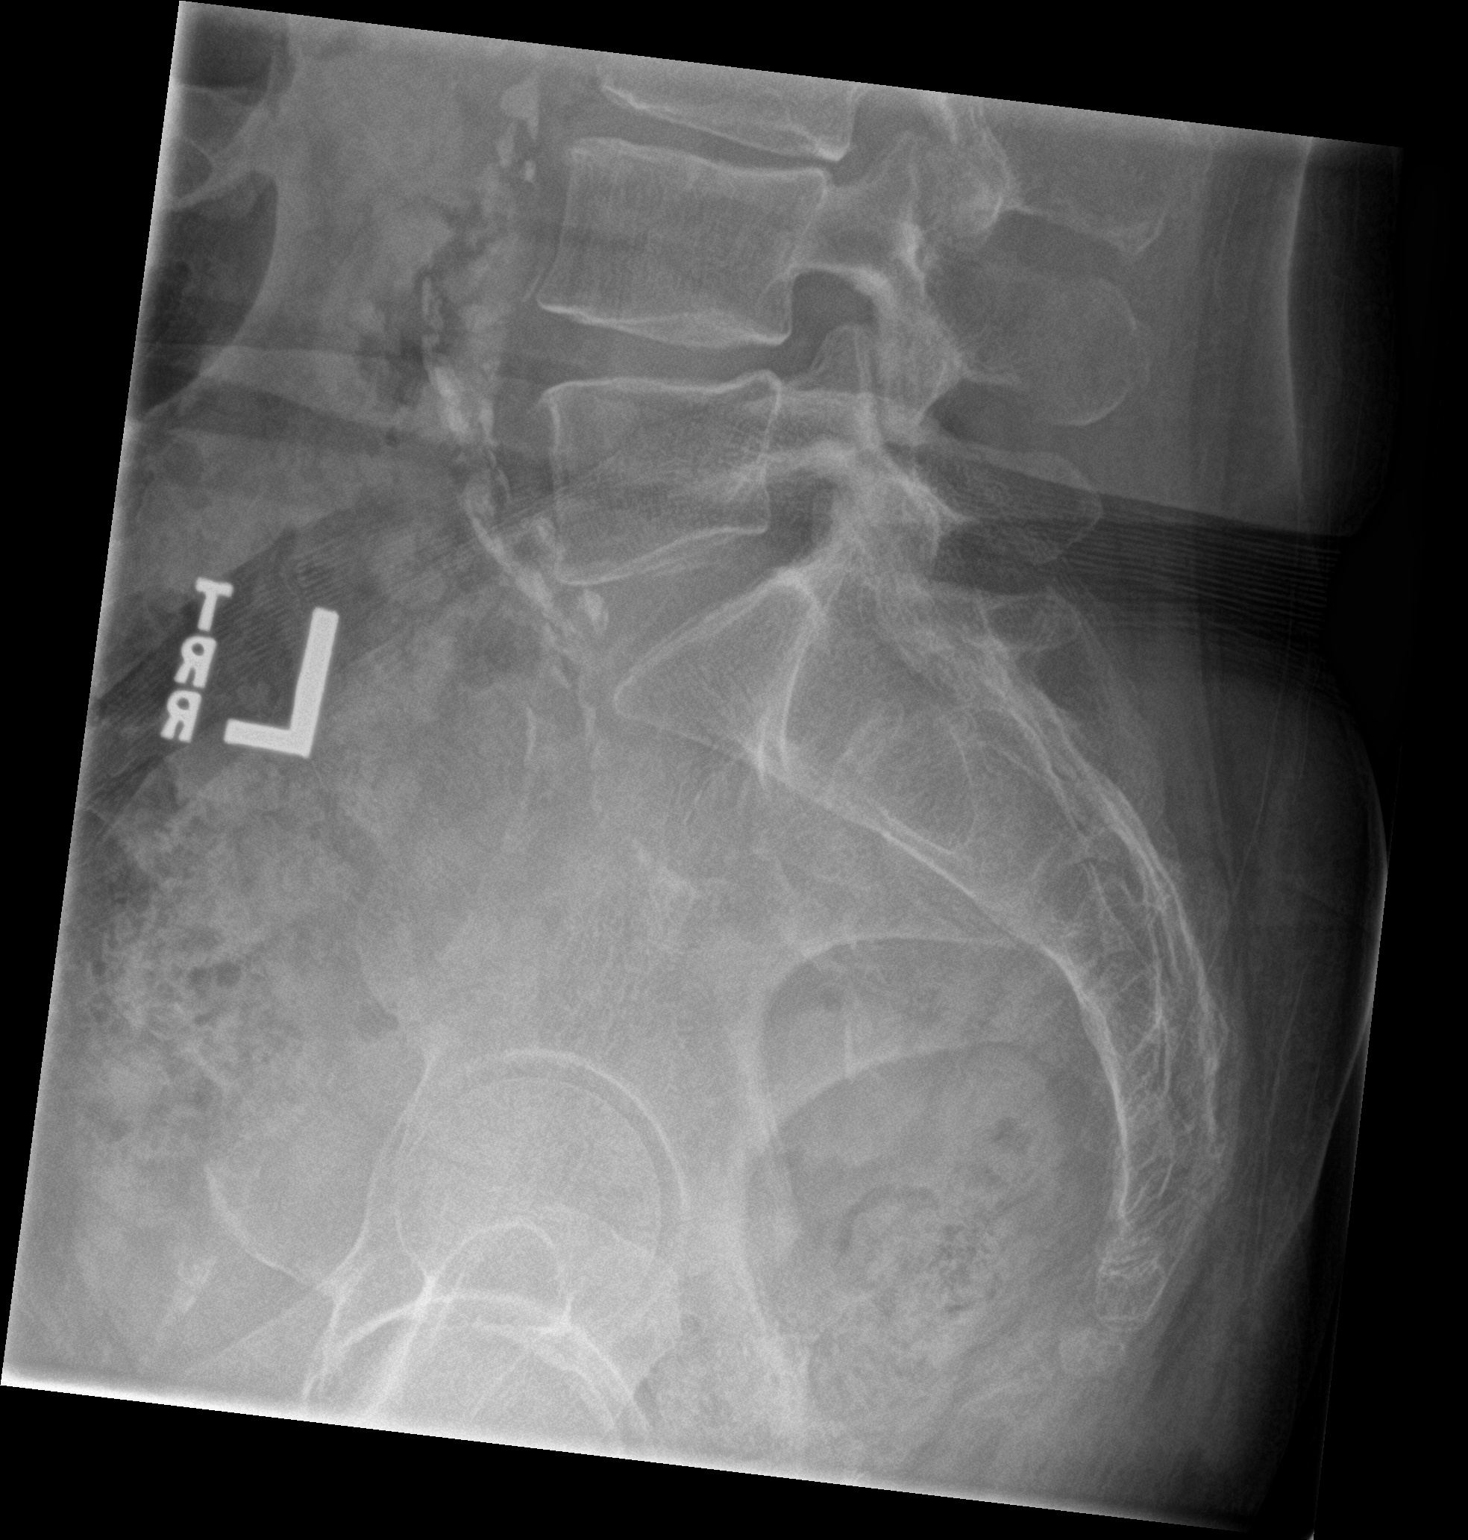

[3 of 3 positions shown; findings below may reference images not displayed]

FINDINGS: Vertebral body alignment and heights are normal. There is mild
spondylosis throughout the lumbar spine to include facet arthropathy
over the lower lumbar spine. There is no evidence of compression
fracture or spondylolisthesis. There is disc space narrowing at the
L1-2,, L3-4 and L4-5 levels. There is calcified plaque over the
distal abdominal aorta and iliac arteries.
IMPRESSION: No acute findings.

Mild spondylosis of the lumbar spine with multilevel disc disease.

## 2019-07-22 IMAGING — CR DG THORACIC SPINE 2V
1 series · 2 of 2 positions shown · non-contrast
Comparison: None.

CLINICAL DATA: Fall 4 days ago in bathtub with back pain.

EXAM:
THORACIC SPINE 2 VIEWS

[Series 1: dg thoracic spine 2 view · 0.14mm/px · 2 of 2 slices shown]
[im 1/2]
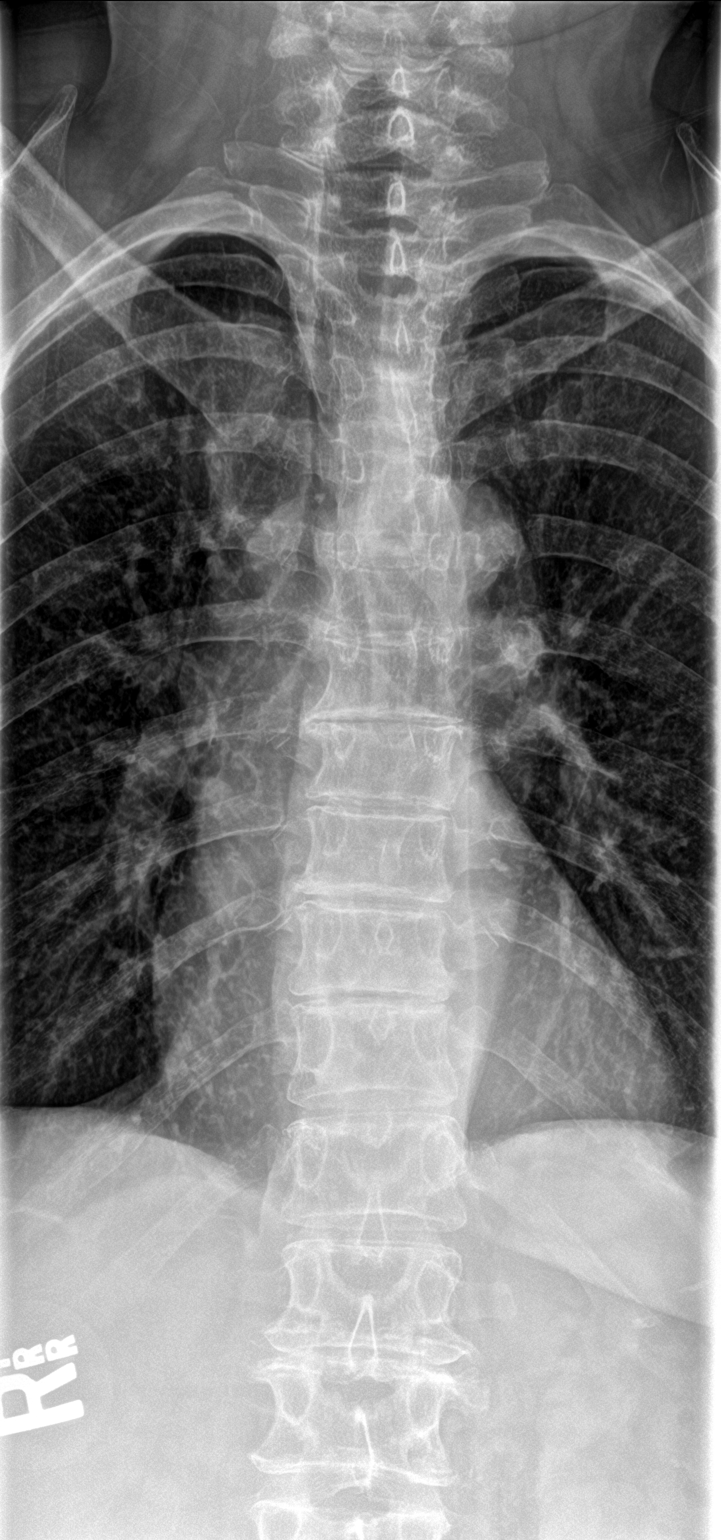
[im 2/2]
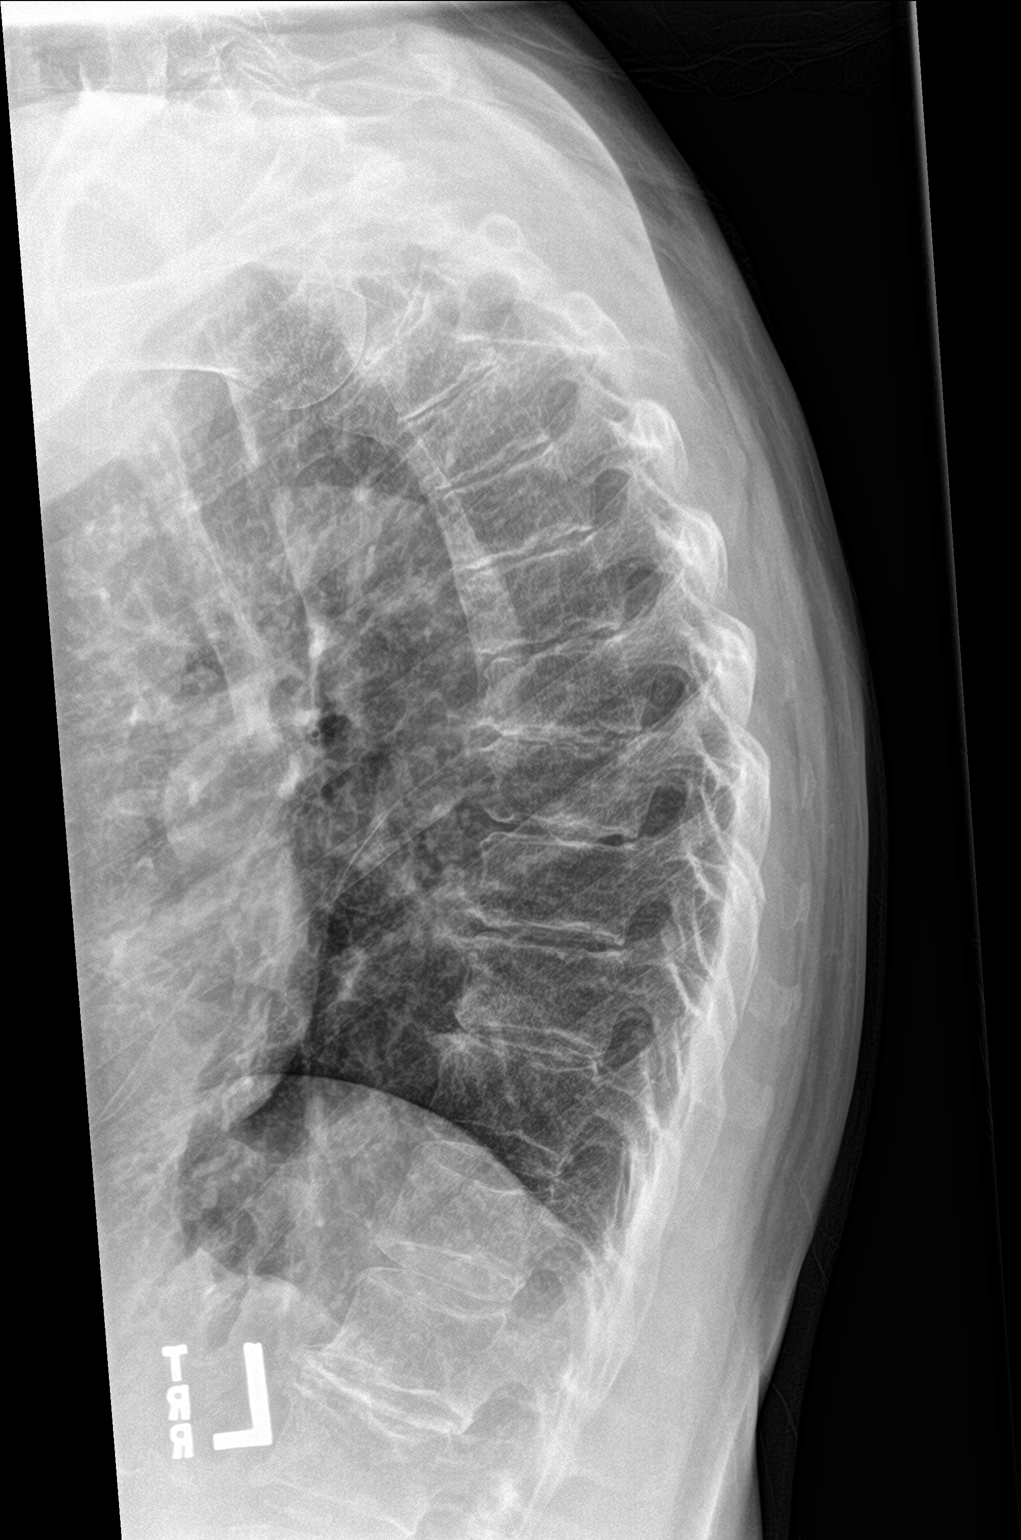

[2 of 2 positions shown; findings below may reference images not displayed]

FINDINGS: Vertebral body alignment is within normal. There is mild spondylosis
throughout the thoracic spine. Pedicles are intact. There is subtle
anterior wedging of a couple adjacent midthoracic vertebral bodies
likely chronic. No evidence of subluxation.
IMPRESSION: No acute findings.

Mild spondylosis throughout the thoracic spine. Subtle anterior
wedging of 2 adjacent mid vertebral bodies almost certainly chronic.

## 2021-11-10 ENCOUNTER — Emergency Department (HOSPITAL_COMMUNITY)
Admission: EM | Admit: 2021-11-10 | Discharge: 2021-11-10 | Disposition: A | Discharge status: Home / Self Care | Payer: Self-pay | Attending: Emergency Medicine | Admitting: Emergency Medicine

## 2021-11-10 ENCOUNTER — Emergency Department (HOSPITAL_COMMUNITY): Payer: Self-pay

## 2021-11-10 ENCOUNTER — Other Ambulatory Visit: Payer: Self-pay

## 2021-11-10 ENCOUNTER — Encounter (HOSPITAL_COMMUNITY): Payer: Self-pay

## 2021-11-10 DIAGNOSIS — Z20822 Contact with and (suspected) exposure to covid-19: Secondary | ICD-10-CM | POA: Insufficient documentation

## 2021-11-10 DIAGNOSIS — R1012 Left upper quadrant pain: Secondary | ICD-10-CM | POA: Insufficient documentation

## 2021-11-10 DIAGNOSIS — R053 Chronic cough: Secondary | ICD-10-CM | POA: Insufficient documentation

## 2021-11-10 DIAGNOSIS — E876 Hypokalemia: Secondary | ICD-10-CM | POA: Insufficient documentation

## 2021-11-10 DIAGNOSIS — R197 Diarrhea, unspecified: Secondary | ICD-10-CM

## 2021-11-10 DIAGNOSIS — Z7982 Long term (current) use of aspirin: Secondary | ICD-10-CM | POA: Insufficient documentation

## 2021-11-10 DIAGNOSIS — R112 Nausea with vomiting, unspecified: Secondary | ICD-10-CM | POA: Insufficient documentation

## 2021-11-10 DIAGNOSIS — R0682 Tachypnea, not elsewhere classified: Secondary | ICD-10-CM | POA: Insufficient documentation

## 2021-11-10 DIAGNOSIS — R000 Tachycardia, unspecified: Secondary | ICD-10-CM | POA: Insufficient documentation

## 2021-11-10 DIAGNOSIS — E860 Dehydration: Secondary | ICD-10-CM | POA: Insufficient documentation

## 2021-11-10 DIAGNOSIS — R300 Dysuria: Secondary | ICD-10-CM | POA: Insufficient documentation

## 2021-11-10 LAB — COMPREHENSIVE METABOLIC PANEL
ALT: 25 U/L (ref 0–44)
AST: 39 U/L (ref 15–41)
Albumin: 3.6 g/dL (ref 3.5–5.0)
Alkaline Phosphatase: 54 U/L (ref 38–126)
Anion gap: 16 — ABNORMAL HIGH (ref 5–15)
BUN: 16 mg/dL (ref 6–20)
CO2: 20 mmol/L — ABNORMAL LOW (ref 22–32)
Calcium: 9.7 mg/dL (ref 8.9–10.3)
Chloride: 100 mmol/L (ref 98–111)
Creatinine, Ser: 2.46 mg/dL — ABNORMAL HIGH (ref 0.44–1.00)
GFR, Estimated: 22 mL/min — ABNORMAL LOW (ref >60)
Glucose, Bld: 108 mg/dL — ABNORMAL HIGH (ref 70–99)
Potassium: 2.8 mmol/L — ABNORMAL LOW (ref 3.5–5.1)
Sodium: 136 mmol/L (ref 135–145)
Total Bilirubin: 0.8 mg/dL (ref 0.3–1.2)
Total Protein: 7.1 g/dL (ref 6.5–8.1)

## 2021-11-10 LAB — CBC
HCT: 41.7 % (ref 36.0–46.0)
Hemoglobin: 14.8 g/dL (ref 12.0–15.0)
MCH: 34.3 pg — ABNORMAL HIGH (ref 26.0–34.0)
MCHC: 35.5 g/dL (ref 30.0–36.0)
MCV: 96.8 fL (ref 80.0–100.0)
Platelets: 263 10*3/uL (ref 150–400)
RBC: 4.31 MIL/uL (ref 3.87–5.11)
RDW: 13.1 % (ref 11.5–15.5)
WBC: 11.6 10*3/uL — ABNORMAL HIGH (ref 4.0–10.5)
nRBC: 0 % (ref 0.0–0.2)

## 2021-11-10 LAB — BASIC METABOLIC PANEL
Anion gap: 8 (ref 5–15)
BUN: 15 mg/dL (ref 6–20)
CO2: 22 mmol/L (ref 22–32)
Calcium: 7.9 mg/dL — ABNORMAL LOW (ref 8.9–10.3)
Chloride: 109 mmol/L (ref 98–111)
Creatinine, Ser: 2.16 mg/dL — ABNORMAL HIGH (ref 0.44–1.00)
GFR, Estimated: 26 mL/min — ABNORMAL LOW (ref >60)
Glucose, Bld: 83 mg/dL (ref 70–99)
Potassium: 2.8 mmol/L — ABNORMAL LOW (ref 3.5–5.1)
Sodium: 139 mmol/L (ref 135–145)

## 2021-11-10 LAB — LACTIC ACID, PLASMA
Lactic Acid, Venous: 2.9 mmol/L (ref 0.5–1.9)
Lactic Acid, Venous: 1.2 mmol/L (ref 0.5–1.9)

## 2021-11-10 LAB — URINALYSIS, ROUTINE W REFLEX MICROSCOPIC
Bacteria, UA: NONE SEEN
Bilirubin Urine: NEGATIVE
Glucose, UA: NEGATIVE mg/dL
Ketones, ur: NEGATIVE mg/dL
Leukocytes,Ua: NEGATIVE
Nitrite: NEGATIVE
Protein, ur: NEGATIVE mg/dL
Specific Gravity, Urine: 1.004 — ABNORMAL LOW (ref 1.005–1.030)
pH: 6 (ref 5.0–8.0)

## 2021-11-10 LAB — POC URINE PREG, ED: Preg Test, Ur: NEGATIVE

## 2021-11-10 LAB — TROPONIN I (HIGH SENSITIVITY)
Troponin I (High Sensitivity): 4 ng/L (ref <18)
Troponin I (High Sensitivity): 5 ng/L (ref <18)

## 2021-11-10 LAB — RESP PANEL BY RT-PCR (FLU A&B, COVID) ARPGX2
Influenza A by PCR: NEGATIVE
Influenza B by PCR: NEGATIVE
SARS Coronavirus 2 by RT PCR: NEGATIVE

## 2021-11-10 LAB — LIPASE, BLOOD: Lipase: 26 U/L (ref 11–51)

## 2021-11-10 MED ORDER — SODIUM CHLORIDE 0.9 % IV BOLUS
1000.0000 mL | Freq: Once | INTRAVENOUS | Status: AC
  Administered 2021-11-10: 1000 mL via INTRAVENOUS

## 2021-11-10 MED ORDER — POTASSIUM CHLORIDE CRYS ER 20 MEQ PO TBCR: 20.0000 meq | EXTENDED_RELEASE_TABLET | Freq: Every day | ORAL | 0 refills | Status: AC

## 2021-11-10 MED ORDER — MORPHINE SULFATE (PF) 4 MG/ML IV SOLN
4.0000 mg | Freq: Once | INTRAVENOUS | Status: AC
  Administered 2021-11-10: 4 mg via INTRAVENOUS
  Filled 2021-11-10: qty 1

## 2021-11-10 MED ORDER — POTASSIUM CHLORIDE CRYS ER 20 MEQ PO TBCR
40.0000 meq | EXTENDED_RELEASE_TABLET | Freq: Once | ORAL | Status: AC
  Administered 2021-11-10: 40 meq via ORAL
  Filled 2021-11-10: qty 2

## 2021-11-10 MED ORDER — ONDANSETRON HCL 4 MG/2ML IJ SOLN
4.0000 mg | Freq: Once | INTRAMUSCULAR | Status: AC
  Administered 2021-11-10: 4 mg via INTRAVENOUS
  Filled 2021-11-10: qty 2

## 2021-11-10 MED ORDER — ONDANSETRON HCL 4 MG PO TABS: 4.0000 mg | ORAL_TABLET | Freq: Three times a day (TID) | ORAL | 0 refills | Status: DC | PRN

## 2021-11-10 NOTE — Discharge Instructions (Addendum)
You were seen in the emergency department for nausea vomiting diarrhea and upper abdominal pain.  You had lab work that showed you were very dehydrated.  Your potassium was also low.  Please hold your hydrochlorothiazide for 1 week.  We are prescribing you some potassium tablets along with some nausea medication.  Please rehydrate using fluids such as Gatorade.  Follow-up with your doctor.  Return to the emergency department if any worsening or concerning symptoms.

## 2021-11-10 NOTE — ED Provider Notes (Signed)
[facility] EMERGENCY DEPARTMENT Provider Note   CSN: [id number] Arrival date & time: 11/10/21  [id number]     History  Chief Complaint  Patient presents with   Abdominal Pain    Deborah Hebert is a 61 y.o. female.  She is here with a complaint of severe left upper quadrant pain nausea vomiting diarrhea that is been going on for 3 days.  Denies any fever.  Does have a cough nonproductive chronic.  Denies chest pain.  No blood in vomitus or diarrhea.  Tolerating some Gatorade.  No sick contacts or recent travel.  Prior surgical history of C-section.  The history is provided by the patient.  Abdominal Pain Pain location:  LUQ Pain quality: stabbing   Pain radiates to:  Does not radiate Pain severity:  Severe Onset quality:  Gradual Duration:  4 days Timing:  Constant Progression:  Unchanged Context: not recent travel and not trauma   Relieved by:  None tried Worsened by:  Nothing Ineffective treatments:  None tried Associated symptoms: cough, diarrhea, dysuria, nausea and vomiting   Associated symptoms: no chest pain, no constipation, no fever, no hematemesis, no hematochezia, no hematuria, no shortness of breath, no sore throat and no vaginal bleeding       Home Medications Prior to Admission medications   Medication Sig Start Date End Date Taking? Authorizing Provider  Aspirin-Acetaminophen-Caffeine (GOODY HEADACHE PO) Take 1 packet by mouth daily as needed (for pain).    [provider]  docusate sodium (COLACE) 100 MG capsule Take 1 tablet once or twice daily as needed for constipation while taking narcotic pain medicine 09/07/15   Hinda Kehr, MD  hydrochlorothiazide (HYDRODIURIL) 12.5 MG tablet Take 12.5 mg by mouth every morning.    [provider]  HYDROcodone-acetaminophen (NORCO/VICODIN) 5-325 MG tablet Take 1-2 tablets by mouth every 6 (six) hours as needed for moderate pain. 09/07/15   Hinda Kehr, MD  ibuprofen (ADVIL,MOTRIN) 800 MG tablet Take 1  tablet (800 mg total) by mouth 3 (three) times daily. 11/04/14   Lily Kocher, PA-C  naproxen (NAPROSYN) 500 MG tablet Take 1 tablet (500 mg total) by mouth 2 (two) times daily with a meal. 05/30/16   Hagler, Jami L, PA-C      Allergies    Patient has no known allergies.    Review of Systems   Review of Systems  Constitutional:  Negative for fever.  HENT:  Negative for sore throat.   Eyes:  Negative for visual disturbance.  Respiratory:  Positive for cough. Negative for shortness of breath.   Cardiovascular:  Negative for chest pain.  Gastrointestinal:  Positive for abdominal pain, diarrhea, nausea and vomiting. Negative for constipation, hematemesis and hematochezia.  Genitourinary:  Positive for dysuria. Negative for hematuria and vaginal bleeding.  Musculoskeletal:  Negative for neck pain.  Skin:  Negative for rash.  Neurological:  Negative for headaches.   Physical Exam Updated Vital Signs BP (!) 145/80 (BP Location: Right Arm)    Pulse 75    Temp 98 F (36.7 C) (Oral)    Resp 17    Ht 5' (1.524 m)    Wt 70.8 kg    SpO2 98%    BMI 30.47 kg/m  Physical Exam Vitals and nursing note reviewed.  Constitutional:      General: She is in acute distress.     Appearance: She is well-developed. She is not ill-appearing or toxic-appearing.  HENT:     Head: Normocephalic and atraumatic.  Eyes:  Conjunctiva/sclera: Conjunctivae normal.  Cardiovascular:     Rate and Rhythm: Regular rhythm. Tachycardia present.     Heart sounds: No murmur heard. Pulmonary:     Effort: Pulmonary effort is normal. Tachypnea present. No respiratory distress.     Breath sounds: Normal breath sounds.  Abdominal:     General: Abdomen is flat.     Palpations: Abdomen is soft.     Tenderness: There is abdominal tenderness in the left upper quadrant. There is no guarding or rebound. Negative signs include Murphy's sign and Rovsing's sign.  Musculoskeletal:        General: No swelling. Normal range of  motion.     Cervical back: Neck supple.     Right lower leg: No edema.     Left lower leg: No edema.  Skin:    General: Skin is warm and dry.     Capillary Refill: Capillary refill takes less than 2 seconds.  Neurological:     General: No focal deficit present.     Mental Status: She is alert.  Psychiatric:        Mood and Affect: Mood normal.    ED Results / Procedures / Treatments   Labs (all labs ordered are listed, but only abnormal results are displayed) Labs Reviewed  COMPREHENSIVE METABOLIC PANEL - Abnormal; Notable for the following components:      Result Value   Potassium 2.8 (*)    CO2 20 (*)    Glucose, Bld 108 (*)    Creatinine, Ser 2.46 (*)    GFR, Estimated 22 (*)    Anion gap 16 (*)    All other components within normal limits  CBC - Abnormal; Notable for the following components:   WBC 11.6 (*)    MCH 34.3 (*)    All other components within normal limits  URINALYSIS, ROUTINE W REFLEX MICROSCOPIC - Abnormal; Notable for the following components:   Color, Urine STRAW (*)    Specific Gravity, Urine 1.004 (*)    Hgb urine dipstick SMALL (*)    All other components within normal limits  LACTIC ACID, PLASMA - Abnormal; Notable for the following components:   Lactic Acid, Venous 2.9 (*)    All other components within normal limits  BASIC METABOLIC PANEL - Abnormal; Notable for the following components:   Potassium 2.8 (*)    Creatinine, Ser 2.16 (*)    Calcium 7.9 (*)    GFR, Estimated 26 (*)    All other components within normal limits  RESP PANEL BY RT-PCR (FLU A&B, COVID) [id number]  CULTURE, BLOOD (ROUTINE X 2)  CULTURE, BLOOD (ROUTINE X 2)  GASTROINTESTINAL PANEL BY PCR, STOOL (REPLACES STOOL CULTURE)  C DIFFICILE QUICK SCREEN W PCR REFLEX    LIPASE, BLOOD  LACTIC ACID, PLASMA  POC URINE PREG, ED  TROPONIN I (HIGH SENSITIVITY)  TROPONIN I (HIGH SENSITIVITY)    EKG EKG Interpretation  Date/Time:  Tuesday November 10 2021 08:18:05 EST Ventricular  Rate:  69 PR Interval:  145 QRS Duration: 84 QT Interval:  387 QTC Calculation: 415 R Axis:   88 Text Interpretation: Sinus rhythm nonspecific ST/Ts No old tracing to compare Confirmed by Aletta Edouard [phone]) on 11/10/2021 8:22:11 AM  Radiology CT Abdomen Pelvis Wo Contrast  Result Date: 11/10/2021 CLINICAL DATA:  Left upper quadrant abdominal pain. EXAM: CT ABDOMEN AND PELVIS WITHOUT CONTRAST TECHNIQUE: Multidetector CT imaging of the abdomen and pelvis was performed following the standard protocol without IV contrast. COMPARISON:  None.  FINDINGS: Lower chest: The lung bases are clear without focal nodule, mass, or airspace disease. Heart size is normal. No significant pleural or pericardial effusion is present. Hepatobiliary: No focal liver abnormality is seen. No gallstones, gallbladder wall thickening, or biliary dilatation. Pancreas: Unremarkable. No pancreatic ductal dilatation or surrounding inflammatory changes. Spleen: Normal in size without focal abnormality. Adrenals/Urinary Tract: A fat density lesion in the right adrenal gland measures up to 15 mm. Left adrenal gland is within normal limits scratched at there is some thickening of the left adrenal gland without a discrete nodule. Kidneys are unremarkable. No stone or mass lesion is present. No obstruction is present. The ureters are within normal limits bilaterally. The urinary bladder is normal. Stomach/Bowel: The stomach and duodenum are within normal limits. Small bowel is unremarkable. Terminal ileum is within normal limits. The appendix is visualized and normal. High density material, question contrast, is seen within the ascending colon. The ascending and transverse colon are within normal limits. Descending colon is unremarkable. Diverticular changes are present in the sigmoid colon without inflammation to suggest diverticulitis. Vascular/Lymphatic: Extensive atherosclerotic calcifications are present in the aorta set and particularly  in the iliac arteries and branch vessels. High-grade stenosis is suggested in the left greater than right common iliac arteries and right external iliac artery. No aneurysm is present. No significant adenopathy is present. Reproductive: Uterus and bilateral adnexa are unremarkable. Other: No abdominal wall hernia or abnormality. No abdominopelvic ascites. Musculoskeletal: Multilevel endplate Schmorl's nodes are present. Vertebral body heights and alignment are otherwise normal. Mild straightening of the normal lumbar lordosis is noted. Bony pelvis is within normal limits. The hips are located and normal. IMPRESSION: 1. No acute or focal lesion to explain the patient's left upper quadrant abdominal pain. 2. Sigmoid diverticulosis without diverticulitis. 3. 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 No aneurysm is present. 4. 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 5. Aortic Atherosclerosis (ICD10-I70.0). Electronically Signed   By: San Morelle M.D.   On: 11/10/2021 10:28   DG Chest Port 1 View  Result Date: 11/10/2021 CLINICAL DATA:  Shortness of breath in a 61 year old female. EXAM: PORTABLE CHEST 1 VIEW COMPARISON:  None FINDINGS: Trachea midline. Cardiomediastinal contours and hilar structures are normal. Lungs are clear. No sign of pleural effusion. No visible pneumothorax. On limited assessment there is no acute skeletal process. IMPRESSION: No acute cardiopulmonary disease. Electronically Signed   By: Zetta Bills M.D.   On: 11/10/2021 08:16    Procedures Procedures    Medications Ordered in ED Medications  sodium chloride 0.9 % bolus 1,000 mL (0 mLs Intravenous Stopped 11/10/21 1109)  ondansetron (ZOFRAN) injection 4 mg (4 mg Intravenous Given 11/10/21 0843)  morphine 4 MG/ML injection 4 mg (4 mg Intravenous Given 11/10/21 0842)   sodium chloride 0.9 % bolus 1,000 mL (0 mLs Intravenous Stopped 11/10/21 1308)  potassium chloride SA (KLOR-CON M) CR tablet 40 mEq (40 mEq Oral Given 11/10/21 1144)    ED Course/ Medical Decision Making/ A&P Clinical Course as of 11/10/21 1838  Tue Nov 10, 2021  0822 Creatinine elevated 2.46.  Potassium low at 2.8.  No priors to compare with. [MB]  [id number] Chest x-ray interpreted by me as no acute infiltrates.  Awaiting radiology reading. [MB]  1422 Patient tolerating p.o. and her abdominal pain is resolved.  Repeat chemistries show her creatinine improving.  Potassium  did not budge much.  We will hold her HCTZ and put her on potassium supplement.  She is comfortable plan. [MB]    Clinical Course User Index [MB] Hayden Rasmussen, MD                           Medical Decision Making  Harland Dingwall Goleman was evaluated in Emergency Department on 11/10/2021 for the symptoms described in the history of present illness. She was evaluated in the context of the global COVID-19 pandemic, which necessitated consideration that the patient might be at risk for infection with the SARS-CoV-2 virus that causes COVID-19. Institutional protocols and algorithms that pertain to the evaluation of patients at risk for COVID-19 are in a state of rapid change based on information released by regulatory bodies including the CDC and federal and state organizations. These policies and algorithms were followed during the patient's care in the ED. This patient presents to the ED for concern of abdominal pain nausea vomiting diarrhea, this involves an extensive number of treatment options, and is a complaint that carries with it a high risk of complications and morbidity.  The differential diagnosis includes diverticulitis, colitis, renal colic, dehydration, peptic ulcer disease, pyelonephritis, pneumonia   Additional history obtained:  Additional history obtained from patient's daughter External records from outside source  obtained and reviewed including epic, no recent admissions   Lab Tests:  I Ordered, reviewed, and interpreted labs.  The pertinent results include: CBC with mildly elevated white count stable hemoglobin, chemistries with elevated creatinine and low bicarb low potassium reflecting some dehydration, urinalysis without signs of infection, pregnancy test negative, left hip elevated and cleared fluids.  Repeat chemistry showing improvement in creatinine.  Potassium still low.   Imaging Studies ordered:  I ordered imaging studies including chest x-ray and CT abdomen and pelvis I independently visualized and interpreted imaging which showed no acute findings I agree with the radiologist interpretation   Cardiac Monitoring:  The patient was maintained on a cardiac monitor.  I personally viewed and interpreted the cardiac monitored which showed an underlying rhythm of: Normal sinus rhythm   Medicines ordered and prescription drug management:  I ordered medication including IV fluids nausea and pain medication, oral potassium for patient's dehydration and hypokalemia Reevaluation of the patient after these medicines showed that the patient improved I have reviewed the patients home medicines and have made adjustments as needed   Critical Interventions:  None   Problem List / ED Course: Dehydration abdominal pain hypokalemia AKI    Reevaluation:  After the interventions noted above, I reevaluated the patient and found that they have :improved   Dispostion:  After consideration of the diagnostic results and the patients response to treatment feel that the patent would benefit from close outpatient follow-up with her primary care doctor.  She is tolerating p.o. and feels she does not need to be admitted to the hospital at this time.  We will have her stop her hydrochlorothiazide and start on some potassium supplementation short-term.  Return instructions discussed.           Final Clinical Impression(s) / ED Diagnoses Final diagnoses:  Left upper quadrant abdominal pain  Nausea vomiting and diarrhea  Dehydration  Hypokalemia    Rx / DC Orders ED Discharge Orders          Ordered    ondansetron (ZOFRAN) 4 MG tablet  Every 8 hours PRN  11/10/21 1421    potassium chloride SA (KLOR-CON M) 20 MEQ tablet  Daily        11/10/21 1421              Hayden Rasmussen, MD 11/10/21 1842

## 2021-11-10 NOTE — ED Triage Notes (Signed)
Patient complaining of left upper quadrant abdominal pain for 2 days with nausea, vomiting, and diarrhea.

## 2021-11-10 NOTE — ED Notes (Signed)
Educated pt on need for stool specimen for testing and provided toilet hat for capture. Pt verbalized understanding.

## 2021-11-15 LAB — CULTURE, BLOOD (ROUTINE X 2)
Culture: NO GROWTH
Culture: NO GROWTH
Special Requests: ADEQUATE
Special Requests: ADEQUATE

## 2022-06-18 ENCOUNTER — Emergency Department (HOSPITAL_COMMUNITY)
Admission: EM | Admit: 2022-06-18 | Discharge: 2022-06-19 | Disposition: A | Discharge status: Home / Self Care | Payer: Medicaid Other | Attending: Emergency Medicine | Admitting: Emergency Medicine

## 2022-06-18 DIAGNOSIS — F17200 Nicotine dependence, unspecified, uncomplicated: Secondary | ICD-10-CM | POA: Insufficient documentation

## 2022-06-18 DIAGNOSIS — J40 Bronchitis, not specified as acute or chronic: Secondary | ICD-10-CM | POA: Insufficient documentation

## 2022-06-19 ENCOUNTER — Encounter (HOSPITAL_COMMUNITY): Payer: Self-pay

## 2022-06-19 ENCOUNTER — Emergency Department (HOSPITAL_COMMUNITY): Payer: Medicaid Other

## 2022-06-19 LAB — CBC WITH DIFFERENTIAL/PLATELET
Abs Immature Granulocytes: 0.01 10*3/uL (ref 0.00–0.07)
Basophils Absolute: 0.1 10*3/uL (ref 0.0–0.1)
Basophils Relative: 1 %
Eosinophils Absolute: 0.3 10*3/uL (ref 0.0–0.5)
Eosinophils Relative: 3 %
HCT: 40.9 % (ref 36.0–46.0)
Hemoglobin: 13.7 g/dL (ref 12.0–15.0)
Immature Granulocytes: 0 %
Lymphocytes Relative: 33 %
Lymphs Abs: 2.7 10*3/uL (ref 0.7–4.0)
MCH: 32.4 pg (ref 26.0–34.0)
MCHC: 33.5 g/dL (ref 30.0–36.0)
MCV: 96.7 fL (ref 80.0–100.0)
Monocytes Absolute: 0.8 10*3/uL (ref 0.1–1.0)
Monocytes Relative: 9 %
Neutro Abs: 4.4 10*3/uL (ref 1.7–7.7)
Neutrophils Relative %: 54 %
Platelets: 224 10*3/uL (ref 150–400)
RBC: 4.23 MIL/uL (ref 3.87–5.11)
RDW: 12.9 % (ref 11.5–15.5)
WBC: 8.2 10*3/uL (ref 4.0–10.5)
nRBC: 0 % (ref 0.0–0.2)

## 2022-06-19 LAB — COMPREHENSIVE METABOLIC PANEL
ALT: 26 U/L (ref 0–44)
AST: 33 U/L (ref 15–41)
Albumin: 4 g/dL (ref 3.5–5.0)
Alkaline Phosphatase: 76 U/L (ref 38–126)
Anion gap: 9 (ref 5–15)
BUN: 12 mg/dL (ref 6–20)
CO2: 23 mmol/L (ref 22–32)
Calcium: 9.8 mg/dL (ref 8.9–10.3)
Chloride: 107 mmol/L (ref 98–111)
Creatinine, Ser: 0.69 mg/dL (ref 0.44–1.00)
GFR, Estimated: >60 mL/min (ref >60)
Glucose, Bld: 108 mg/dL — ABNORMAL HIGH (ref 70–99)
Potassium: 3.6 mmol/L (ref 3.5–5.1)
Sodium: 139 mmol/L (ref 135–145)
Total Bilirubin: 0.8 mg/dL (ref 0.3–1.2)
Total Protein: 7.5 g/dL (ref 6.5–8.1)

## 2022-06-19 LAB — TROPONIN I (HIGH SENSITIVITY)
Troponin I (High Sensitivity): 2 ng/L (ref <18)
Troponin I (High Sensitivity): 3 ng/L (ref <18)

## 2022-06-19 LAB — BRAIN NATRIURETIC PEPTIDE: B Natriuretic Peptide: 95 pg/mL (ref 0.0–100.0)

## 2022-06-19 MED ORDER — AEROCHAMBER PLUS FLO-VU MISC
1.0000 | Freq: Once | Status: AC
Start: 2022-06-19 — End: 2022-06-19
  Administered 2022-06-19: 1
  Filled 2022-06-19: qty 1

## 2022-06-19 MED ORDER — DOXYCYCLINE HYCLATE 100 MG PO CAPS: 100.0000 mg | ORAL_CAPSULE | Freq: Two times a day (BID) | ORAL | 0 refills | Status: DC

## 2022-06-19 MED ORDER — PREDNISONE 20 MG PO TABS: ORAL_TABLET | ORAL | 0 refills | Status: DC

## 2022-06-19 MED ORDER — ALBUTEROL SULFATE HFA 108 (90 BASE) MCG/ACT IN AERS
2.0000 | INHALATION_SPRAY | Freq: Once | RESPIRATORY_TRACT | Status: AC
  Administered 2022-06-19: 2 via RESPIRATORY_TRACT
  Filled 2022-06-19: qty 6.7

## 2022-06-19 MED ORDER — IPRATROPIUM-ALBUTEROL 0.5-2.5 (3) MG/3ML IN SOLN
3.0000 mL | RESPIRATORY_TRACT | Status: AC
  Administered 2022-06-19 (×3): 3 mL via RESPIRATORY_TRACT
  Filled 2022-06-19: qty 9

## 2022-06-19 MED ORDER — DOXYCYCLINE HYCLATE 100 MG PO TABS
100.0000 mg | ORAL_TABLET | Freq: Once | ORAL | Status: AC
  Administered 2022-06-19: 100 mg via ORAL
  Filled 2022-06-19: qty 1

## 2022-06-19 MED ORDER — PREDNISONE 50 MG PO TABS
60.0000 mg | ORAL_TABLET | Freq: Once | ORAL | Status: AC
  Administered 2022-06-19: 60 mg via ORAL
  Filled 2022-06-19: qty 1

## 2022-06-19 NOTE — ED Triage Notes (Signed)
Pt brought in from [facility] c/o increased SOB. Pt has had cough for several days but has been unable to cough up anything today.

## 2022-06-19 NOTE — ED Provider Notes (Signed)
[facility] EMERGENCY DEPARTMENT Provider Note   CSN: [id number] Arrival date & time: 06/18/22  2357     History  Chief Complaint  Patient presents with   Shortness of Breath    Deborah Hebert is a 61 y.o. female.  61 yo F here with dyspnea. Long time smoker but no diagnosis of COPD.  Patient states that she has been short of breath with a nonproductive cough and chest congestion for the last few days but tonight it got significantly worse.  She is here with her daughter who tried to give her an albuterol inhaler in route but the inhaler was broken.  Patient without any history of fever.   Shortness of Breath      Home Medications Prior to Admission medications   Medication Sig Start Date End Date Taking? Authorizing Provider  doxycycline (VIBRAMYCIN) 100 MG capsule Take 1 capsule (100 mg total) by mouth 2 (two) times daily. One po bid x 7 days 06/19/22  Yes Galit Urich, Barbara Cower, MD  predniSONE (DELTASONE) 20 MG tablet 3 tabs po day one, then 2 tabs daily x 4 days 06/19/22  Yes Lanijah Warzecha, Barbara Cower, MD  atorvastatin (LIPITOR) 20 MG tablet Take 20 mg by mouth daily. 10/16/21   [provider]  docusate sodium (COLACE) 100 MG capsule Take 1 tablet once or twice daily as needed for constipation while taking narcotic pain medicine Patient not taking: Reported on 11/10/2021 09/07/15   Loleta Rose, MD  hydrochlorothiazide (HYDRODIURIL) 12.5 MG tablet Take 12.5 mg by mouth every morning.    [provider]  HYDROcodone-acetaminophen (NORCO/VICODIN) 5-325 MG tablet Take 1-2 tablets by mouth every 6 (six) hours as needed for moderate pain. Patient not taking: Reported on 11/10/2021 09/07/15   Loleta Rose, MD  ibuprofen (ADVIL,MOTRIN) 800 MG tablet Take 1 tablet (800 mg total) by mouth 3 (three) times daily. Patient not taking: Reported on 11/10/2021 11/04/14   Ivery Quale, PA-C  losartan (COZAAR) 50 MG tablet Take 50 mg by mouth daily. 10/15/21   [provider]  naproxen  (NAPROSYN) 500 MG tablet Take 1 tablet (500 mg total) by mouth 2 (two) times daily with a meal. Patient not taking: Reported on 11/10/2021 05/30/16   Hagler, Jami L, PA-C  ondansetron (ZOFRAN) 4 MG tablet Take 1 tablet (4 mg total) by mouth every 8 (eight) hours as needed for nausea or vomiting. 11/10/21   Terrilee Files, MD  potassium chloride SA (KLOR-CON M) 20 MEQ tablet Take 1 tablet (20 mEq total) by mouth daily. 11/10/21   Terrilee Files, MD  traZODone (DESYREL) 50 MG tablet Take 75 mg by mouth at bedtime. 10/15/21   [provider]      Allergies    Patient has no known allergies.    Review of Systems   Review of Systems  Respiratory:  Positive for shortness of breath.     Physical Exam Updated Vital Signs BP 134/78   Pulse 91   Temp 97.8 F (36.6 C) (Oral)   Resp 18   Ht 5' (1.524 m)   Wt 70.8 kg   SpO2 95%   BMI 30.48 kg/m  Physical Exam Vitals and nursing note reviewed.  Constitutional:      Appearance: She is well-developed.  HENT:     Head: Normocephalic and atraumatic.  Cardiovascular:     Rate and Rhythm: Normal rate and regular rhythm.  Pulmonary:     Effort: Tachypnea and accessory muscle usage present. No respiratory distress.  Breath sounds: No stridor. Decreased breath sounds and wheezing present.  Chest:     Chest wall: No mass or tenderness.  Abdominal:     General: There is no distension.  Musculoskeletal:     Cervical back: Normal range of motion.     Right lower leg: No edema.     Left lower leg: No edema.  Skin:    General: Skin is warm and dry.  Neurological:     General: No focal deficit present.     Mental Status: She is alert.     ED Results / Procedures / Treatments   Labs (all labs ordered are listed, but only abnormal results are displayed) Labs Reviewed  COMPREHENSIVE METABOLIC PANEL - Abnormal; Notable for the following components:      Result Value   Glucose, Bld 108 (*)    All other components within normal  limits  CBC WITH DIFFERENTIAL/PLATELET  BRAIN NATRIURETIC PEPTIDE  TROPONIN I (HIGH SENSITIVITY)  TROPONIN I (HIGH SENSITIVITY)    EKG None  Radiology DG Chest Portable 1 View  Result Date: 06/19/2022 CLINICAL DATA:  Respiratory distress. EXAM: PORTABLE CHEST 1 VIEW COMPARISON:  November 10, 2021 FINDINGS: The heart size and mediastinal contours are within normal limits. Very mild atelectatic changes are seen within the bilateral lung bases. There is no evidence of an acute infiltrate, pleural effusion or pneumothorax. The visualized skeletal structures are unremarkable. IMPRESSION: No active disease. Electronically Signed   By: Aram Candela M.D.   On: 06/19/2022 00:26    Procedures Procedures    Medications Ordered in ED Medications  ipratropium-albuterol (DUONEB) 0.5-2.5 (3) MG/3ML nebulizer solution 3 mL (3 mLs Nebulization Given 06/19/22 0020)  predniSONE (DELTASONE) tablet 60 mg (60 mg Oral Given 06/19/22 0227)  doxycycline (VIBRA-TABS) tablet 100 mg (100 mg Oral Given 06/19/22 0227)  albuterol (VENTOLIN HFA) 108 (90 Base) MCG/ACT inhaler 2 puff (2 puffs Inhalation Given 06/19/22 0227)  aerochamber plus with mask device 1 each (1 each Other Given 06/19/22 0228)    ED Course/ Medical Decision Making/ A&P                           Medical Decision Making Amount and/or Complexity of Data Reviewed Labs: ordered. Radiology: ordered. ECG/medicine tests: ordered.  Risk Prescription drug management.  Suspect undiagnosed COPD vs acute bronchitis. Will treat as same unless CXR/labs are concerning for another etiology like CHF.  CXR done and showed no obvious opacities or pneumothorax (independently viewed and interpreted by myself and radiology read reviewed).  Improved WOB, aeration and wheezing since medications. Prednisone and abx started. Albuterol inhaler with spacer provided. D/w family, will attempt to get pulmonary follow up for PFT's.   Ambulated to bathroom and back  without difficulty, dyspnea or hypoxia. Felt to be stable for discharge. Doesn't want to quit smoking at this time.   Final Clinical Impression(s) / ED Diagnoses Final diagnoses:  Bronchitis    Rx / DC Orders ED Discharge Orders          Ordered    predniSONE (DELTASONE) 20 MG tablet        06/19/22 0319    doxycycline (VIBRAMYCIN) 100 MG capsule  2 times daily        06/19/22 0319              Sherald Balbuena, Barbara Cower, MD 06/19/22 [phone]

## 2023-11-16 ENCOUNTER — Other Ambulatory Visit: Payer: Self-pay | Admitting: Specialist

## 2023-11-16 DIAGNOSIS — F17210 Nicotine dependence, cigarettes, uncomplicated: Secondary | ICD-10-CM

## 2023-11-16 DIAGNOSIS — R0609 Other forms of dyspnea: Secondary | ICD-10-CM

## 2023-12-21 ENCOUNTER — Ambulatory Visit
Admission: RE | Admit: 2023-12-21 | Discharge: 2023-12-21 | Disposition: A | Discharge status: Home / Self Care | Payer: Medicaid Other | Source: Ambulatory Visit | Attending: Specialist | Admitting: Specialist

## 2023-12-21 DIAGNOSIS — R0609 Other forms of dyspnea: Secondary | ICD-10-CM | POA: Diagnosis present

## 2023-12-21 DIAGNOSIS — F17210 Nicotine dependence, cigarettes, uncomplicated: Secondary | ICD-10-CM | POA: Insufficient documentation

## 2024-03-29 ENCOUNTER — Other Ambulatory Visit: Payer: Self-pay | Admitting: Specialist

## 2024-03-29 DIAGNOSIS — R911 Solitary pulmonary nodule: Secondary | ICD-10-CM

## 2024-04-10 ENCOUNTER — Ambulatory Visit: Admission: RE | Admit: 2024-04-10 | Source: Ambulatory Visit

## 2024-12-08 ENCOUNTER — Emergency Department (HOSPITAL_COMMUNITY)

## 2024-12-08 ENCOUNTER — Other Ambulatory Visit: Payer: Self-pay

## 2024-12-08 ENCOUNTER — Encounter (HOSPITAL_COMMUNITY): Payer: Self-pay

## 2024-12-08 ENCOUNTER — Inpatient Hospital Stay (HOSPITAL_COMMUNITY)
Admission: EM | Admit: 2024-12-08 | Discharge: 2024-12-13 | DRG: 189 | Disposition: A | Attending: Internal Medicine | Admitting: Internal Medicine

## 2024-12-08 DIAGNOSIS — E785 Hyperlipidemia, unspecified: Secondary | ICD-10-CM | POA: Diagnosis present

## 2024-12-08 DIAGNOSIS — W010XXA Fall on same level from slipping, tripping and stumbling without subsequent striking against object, initial encounter: Secondary | ICD-10-CM | POA: Diagnosis present

## 2024-12-08 DIAGNOSIS — F17210 Nicotine dependence, cigarettes, uncomplicated: Secondary | ICD-10-CM | POA: Diagnosis present

## 2024-12-08 DIAGNOSIS — S2249XA Multiple fractures of ribs, unspecified side, initial encounter for closed fracture: Secondary | ICD-10-CM | POA: Insufficient documentation

## 2024-12-08 DIAGNOSIS — S2242XA Multiple fractures of ribs, left side, initial encounter for closed fracture: Secondary | ICD-10-CM | POA: Diagnosis present

## 2024-12-08 DIAGNOSIS — J9601 Acute respiratory failure with hypoxia: Principal | ICD-10-CM | POA: Diagnosis present

## 2024-12-08 DIAGNOSIS — Z6831 Body mass index (BMI) 31.0-31.9, adult: Secondary | ICD-10-CM

## 2024-12-08 DIAGNOSIS — R0902 Hypoxemia: Principal | ICD-10-CM

## 2024-12-08 DIAGNOSIS — I10 Essential (primary) hypertension: Secondary | ICD-10-CM | POA: Diagnosis present

## 2024-12-08 DIAGNOSIS — J9811 Atelectasis: Secondary | ICD-10-CM | POA: Diagnosis present

## 2024-12-08 DIAGNOSIS — E66811 Obesity, class 1: Secondary | ICD-10-CM | POA: Diagnosis present

## 2024-12-08 DIAGNOSIS — J441 Chronic obstructive pulmonary disease with (acute) exacerbation: Secondary | ICD-10-CM | POA: Diagnosis present

## 2024-12-08 DIAGNOSIS — Z79899 Other long term (current) drug therapy: Secondary | ICD-10-CM

## 2024-12-08 LAB — BASIC METABOLIC PANEL WITH GFR
Anion gap: 17 — ABNORMAL HIGH (ref 5–15)
BUN: 17 mg/dL (ref 8–23)
CO2: 24 mmol/L (ref 22–32)
Calcium: 10 mg/dL (ref 8.9–10.3)
Chloride: 97 mmol/L — ABNORMAL LOW (ref 98–111)
Creatinine, Ser: 0.64 mg/dL (ref 0.44–1.00)
GFR, Estimated: >60 mL/min
Glucose, Bld: 96 mg/dL (ref 70–99)
Potassium: 4.8 mmol/L (ref 3.5–5.1)
Sodium: 138 mmol/L (ref 135–145)

## 2024-12-08 LAB — CBC WITH DIFFERENTIAL/PLATELET
Abs Immature Granulocytes: 0.04 10*3/uL (ref 0.00–0.07)
Basophils Absolute: 0.1 10*3/uL (ref 0.0–0.1)
Basophils Relative: 1 %
Eosinophils Absolute: 0 10*3/uL (ref 0.0–0.5)
Eosinophils Relative: 0 %
HCT: 47.2 % — ABNORMAL HIGH (ref 36.0–46.0)
Hemoglobin: 15.6 g/dL — ABNORMAL HIGH (ref 12.0–15.0)
Immature Granulocytes: 0 %
Lymphocytes Relative: 10 %
Lymphs Abs: 1.1 10*3/uL (ref 0.7–4.0)
MCH: 32.8 pg (ref 26.0–34.0)
MCHC: 33.1 g/dL (ref 30.0–36.0)
MCV: 99.4 fL (ref 80.0–100.0)
Monocytes Absolute: 0.9 10*3/uL (ref 0.1–1.0)
Monocytes Relative: 7 %
Neutro Abs: 9.9 10*3/uL — ABNORMAL HIGH (ref 1.7–7.7)
Neutrophils Relative %: 82 %
Platelets: 219 10*3/uL (ref 150–400)
RBC: 4.75 MIL/uL (ref 3.87–5.11)
RDW: 13.2 % (ref 11.5–15.5)
WBC: 12.1 10*3/uL — ABNORMAL HIGH (ref 4.0–10.5)
nRBC: 0 % (ref 0.0–0.2)

## 2024-12-08 LAB — HEMOGLOBIN A1C
Hgb A1c MFr Bld: 5.1 % (ref 4.8–5.6)
Mean Plasma Glucose: 99.67 mg/dL

## 2024-12-08 MED ORDER — ADULT MULTIVITAMIN W/MINERALS CH
1.0000 | ORAL_TABLET | Freq: Every day | ORAL | Status: DC
  Administered 2024-12-08 – 2024-12-13 (×6): 1 via ORAL
  Filled 2024-12-08 (×6): qty 1

## 2024-12-08 MED ORDER — ONDANSETRON HCL 4 MG PO TABS
4.0000 mg | ORAL_TABLET | Freq: Three times a day (TID) | ORAL | Status: DC | PRN
  Administered 2024-12-11 – 2024-12-12 (×2): 4 mg via ORAL
  Filled 2024-12-08 (×2): qty 1

## 2024-12-08 MED ORDER — IPRATROPIUM-ALBUTEROL 0.5-2.5 (3) MG/3ML IN SOLN
3.0000 mL | Freq: Four times a day (QID) | RESPIRATORY_TRACT | Status: DC
  Administered 2024-12-08 (×2): 3 mL via RESPIRATORY_TRACT
  Filled 2024-12-08 (×2): qty 3

## 2024-12-08 MED ORDER — MAGNESIUM SULFATE 2 GM/50ML IV SOLN
2.0000 g | Freq: Once | INTRAVENOUS | Status: AC
  Administered 2024-12-08: 2 g via INTRAVENOUS
  Filled 2024-12-08: qty 50

## 2024-12-08 MED ORDER — METHYLPREDNISOLONE SODIUM SUCC 125 MG IJ SOLR
125.0000 mg | Freq: Once | INTRAMUSCULAR | Status: AC
  Administered 2024-12-08: 125 mg via INTRAVENOUS
  Filled 2024-12-08: qty 2

## 2024-12-08 MED ORDER — TRAZODONE HCL 50 MG PO TABS
50.0000 mg | ORAL_TABLET | Freq: Every day | ORAL | Status: DC
  Administered 2024-12-08 – 2024-12-12 (×5): 50 mg via ORAL
  Filled 2024-12-08 (×5): qty 1

## 2024-12-08 MED ORDER — MORPHINE SULFATE (PF) 2 MG/ML IV SOLN
2.0000 mg | Freq: Once | INTRAVENOUS | Status: AC
  Administered 2024-12-08: 2 mg via INTRAVENOUS
  Filled 2024-12-08: qty 1

## 2024-12-08 MED ORDER — HYDROCHLOROTHIAZIDE 25 MG PO TABS
12.5000 mg | ORAL_TABLET | Freq: Every morning | ORAL | Status: DC
  Administered 2024-12-09 – 2024-12-12 (×4): 12.5 mg via ORAL
  Filled 2024-12-08 (×5): qty 1

## 2024-12-08 MED ORDER — ALBUTEROL SULFATE (2.5 MG/3ML) 0.083% IN NEBU: 2.5000 mg | INHALATION_SOLUTION | Freq: Four times a day (QID) | RESPIRATORY_TRACT | Status: DC | PRN

## 2024-12-08 MED ORDER — ACETAMINOPHEN 650 MG RE SUPP: 650.0000 mg | Freq: Four times a day (QID) | RECTAL | Status: DC | PRN

## 2024-12-08 MED ORDER — IPRATROPIUM-ALBUTEROL 0.5-2.5 (3) MG/3ML IN SOLN
3.0000 mL | Freq: Once | RESPIRATORY_TRACT | Status: AC
  Administered 2024-12-08: 3 mL via RESPIRATORY_TRACT
  Filled 2024-12-08: qty 3

## 2024-12-08 MED ORDER — PREDNISONE 20 MG PO TABS
20.0000 mg | ORAL_TABLET | Freq: Every day | ORAL | Status: AC
  Administered 2024-12-09 – 2024-12-11 (×3): 20 mg via ORAL
  Filled 2024-12-08 (×3): qty 1

## 2024-12-08 MED ORDER — ACETAMINOPHEN 325 MG PO TABS
650.0000 mg | ORAL_TABLET | Freq: Four times a day (QID) | ORAL | Status: DC | PRN
  Administered 2024-12-10 – 2024-12-11 (×3): 650 mg via ORAL
  Filled 2024-12-08 (×3): qty 2

## 2024-12-08 MED ORDER — DOCUSATE SODIUM 100 MG PO CAPS: 100.0000 mg | ORAL_CAPSULE | Freq: Two times a day (BID) | ORAL | Status: DC

## 2024-12-08 MED ORDER — ALBUTEROL SULFATE (2.5 MG/3ML) 0.083% IN NEBU
2.5000 mg | INHALATION_SOLUTION | Freq: Once | RESPIRATORY_TRACT | Status: AC
  Administered 2024-12-08: 2.5 mg via RESPIRATORY_TRACT
  Filled 2024-12-08: qty 3

## 2024-12-08 MED ORDER — ENOXAPARIN SODIUM 40 MG/0.4ML IJ SOSY
40.0000 mg | PREFILLED_SYRINGE | INTRAMUSCULAR | Status: DC
  Administered 2024-12-08 – 2024-12-13 (×6): 40 mg via SUBCUTANEOUS
  Filled 2024-12-08 (×6): qty 0.4

## 2024-12-08 MED ORDER — MORPHINE SULFATE (PF) 2 MG/ML IV SOLN
2.0000 mg | INTRAVENOUS | Status: DC | PRN
  Administered 2024-12-08 – 2024-12-09 (×4): 2 mg via INTRAVENOUS
  Filled 2024-12-08 (×5): qty 1

## 2024-12-08 MED ORDER — LOSARTAN POTASSIUM 25 MG PO TABS
50.0000 mg | ORAL_TABLET | Freq: Every day | ORAL | Status: DC
  Administered 2024-12-09 – 2024-12-12 (×4): 50 mg via ORAL
  Filled 2024-12-08 (×5): qty 2

## 2024-12-08 MED ORDER — ATORVASTATIN CALCIUM 10 MG PO TABS
20.0000 mg | ORAL_TABLET | Freq: Every day | ORAL | Status: DC
  Administered 2024-12-09 – 2024-12-13 (×5): 20 mg via ORAL
  Filled 2024-12-08 (×6): qty 2

## 2024-12-08 NOTE — ED Provider Notes (Signed)
 " [place] EMERGENCY DEPARTMENT AT [facility] Provider Note   CSN: [id number] Arrival date & time: 12/08/24  9172     Patient presents with: Fall (Left sided chest pain)   Deborah Hebert is a 64 y.o. female.   Patient history of COPD.  Patient fell yesterday on her left chest.  She complains of chest pain and shortness of breath  The history is provided by the patient and medical records. No language interpreter was used.  Fall This is a new problem. The current episode started yesterday. The problem occurs rarely. The problem has not changed since onset.Associated symptoms include chest pain and shortness of breath. Pertinent negatives include no abdominal pain and no headaches. Nothing aggravates the symptoms. Nothing relieves the symptoms. She has tried nothing for the symptoms.       Prior to Admission medications  Medication Sig Start Date End Date Taking? Authorizing Provider  atorvastatin  (LIPITOR) 20 MG tablet Take 20 mg by mouth daily. 10/16/21   [provider]  docusate sodium  (COLACE) 100 MG capsule Take 1 tablet once or twice daily as needed for constipation while taking narcotic pain medicine Patient not taking: Reported on 11/10/2021 09/07/15   Gordan Huxley, MD  doxycycline  (VIBRAMYCIN ) 100 MG capsule Take 1 capsule (100 mg total) by mouth 2 (two) times daily. One po bid x 7 days 06/19/22   Mesner, Selinda, MD  hydrochlorothiazide  (HYDRODIURIL ) 12.5 MG tablet Take 12.5 mg by mouth every morning.    [provider]  HYDROcodone -acetaminophen  (NORCO/VICODIN) 5-325 MG tablet Take 1-2 tablets by mouth every 6 (six) hours as needed for moderate pain. Patient not taking: Reported on 11/10/2021 09/07/15   Gordan Huxley, MD  ibuprofen  (ADVIL ,MOTRIN ) 800 MG tablet Take 1 tablet (800 mg total) by mouth 3 (three) times daily. Patient not taking: Reported on 11/10/2021 11/04/14   Armida Culver, PA-C  losartan  (COZAAR ) 50 MG tablet Take 50 mg by mouth daily.  10/15/21   [provider]  naproxen  (NAPROSYN ) 500 MG tablet Take 1 tablet (500 mg total) by mouth 2 (two) times daily with a meal. Patient not taking: Reported on 11/10/2021 05/30/16   Hagler, Jami L, PA-C  ondansetron  (ZOFRAN ) 4 MG tablet Take 1 tablet (4 mg total) by mouth every 8 (eight) hours as needed for nausea or vomiting. 11/10/21   Towana Ozell BROCKS, MD  potassium chloride  SA (KLOR-CON  M) 20 MEQ tablet Take 1 tablet (20 mEq total) by mouth daily. 11/10/21   Towana Ozell BROCKS, MD  predniSONE  (DELTASONE ) 20 MG tablet 3 tabs po day one, then 2 tabs daily x 4 days 06/19/22   Mesner, Selinda, MD  traZODone  (DESYREL ) 50 MG tablet Take 75 mg by mouth at bedtime. 10/15/21   [provider]    Allergies: Patient has no known allergies.    Review of Systems  Constitutional:  Negative for appetite change and fatigue.  HENT:  Negative for congestion, ear discharge and sinus pressure.   Eyes:  Negative for discharge.  Respiratory:  Positive for shortness of breath. Negative for cough.   Cardiovascular:  Positive for chest pain.  Gastrointestinal:  Negative for abdominal pain and diarrhea.  Genitourinary:  Negative for frequency and hematuria.  Musculoskeletal:  Negative for back pain.  Skin:  Negative for rash.  Neurological:  Negative for seizures and headaches.  Psychiatric/Behavioral:  Negative for hallucinations.     Updated Vital Signs BP 119/74   Pulse (!) 110   Temp 98.7 F (37.1  C) (Oral)   Resp (!) 21   Ht 5' (1.524 m)   Wt 70.8 kg   SpO2 90%   BMI 30.47 kg/m   Physical Exam Vitals and nursing note reviewed.  Constitutional:      Appearance: She is well-developed.  HENT:     Head: Normocephalic.     Nose: Nose normal.  Eyes:     General: No scleral icterus.    Conjunctiva/sclera: Conjunctivae normal.  Neck:     Thyroid: No thyromegaly.  Cardiovascular:     Rate and Rhythm: Normal rate and regular rhythm.     Heart sounds: No murmur heard.    No  friction rub. No gallop.  Pulmonary:     Breath sounds: No stridor. Wheezing present. No rales.     Comments: Tender left chest Chest:     Chest wall: No tenderness.  Abdominal:     General: There is no distension.     Tenderness: There is no abdominal tenderness. There is no rebound.  Musculoskeletal:        General: Normal range of motion.     Cervical back: Neck supple.  Lymphadenopathy:     Cervical: No cervical adenopathy.  Skin:    Findings: No erythema or rash.  Neurological:     Mental Status: She is alert and oriented to person, place, and time.     Motor: No abnormal muscle tone.     Coordination: Coordination normal.  Psychiatric:        Behavior: Behavior normal.     (all labs ordered are listed, but only abnormal results are displayed) Labs Reviewed  CBC WITH DIFFERENTIAL/PLATELET - Abnormal; Notable for the following components:      Result Value   WBC 12.1 (*)    Hemoglobin 15.6 (*)    HCT 47.2 (*)    Neutro Abs 9.9 (*)    All other components within normal limits  BASIC METABOLIC PANEL WITH GFR - Abnormal; Notable for the following components:   Chloride 97 (*)    Anion gap 17 (*)    All other components within normal limits  HEMOGLOBIN A1C    EKG: None  Radiology: DG Ribs Unilateral W/Chest Left Result Date: 12/08/2024 CLINICAL DATA:  pain EXAM: LEFT RIBS AND CHEST - 3+ VIEW COMPARISON:  December 21, 2023, June 19, 2022 FINDINGS: The cardiomediastinal silhouette is unchanged in contour.Atherosclerotic calcifications no pleural effusion. No pneumothorax. Patchy platelike opacities. There is a mildly displaced fracture of the LEFT posterolateral eighth rib and a possible nondisplaced fracture of the LEFT posterolateral ninth rib. IMPRESSION: 1. Mildly displaced fracture of the LEFT posterolateral eighth rib and possible nondisplaced fracture of the LEFT posterolateral ninth rib. 2. Patchy platelike opacities likely reflect atelectasis. Electronically  Signed   By: Corean Salter M.D.   On: 12/08/2024 09:35     Procedures   Medications Ordered in the ED  atorvastatin  (LIPITOR) tablet 20 mg (has no administration in time range)  hydrochlorothiazide  (HYDRODIURIL ) tablet 12.5 mg (has no administration in time range)  losartan  (COZAAR ) tablet 50 mg (has no administration in time range)  traZODone  (DESYREL ) tablet 50 mg (has no administration in time range)  docusate sodium  (COLACE) capsule 100 mg (has no administration in time range)  ondansetron  (ZOFRAN ) tablet 4 mg (has no administration in time range)  albuterol  (PROVENTIL ) (2.5 MG/3ML) 0.083% nebulizer solution 2.5 mg (has no administration in time range)  predniSONE  (DELTASONE ) tablet 20 mg (has no administration in time range)  ipratropium-albuterol  (DUONEB) 0.5-2.5 (3) MG/3ML nebulizer solution 3 mL (has no administration in time range)  enoxaparin  (LOVENOX ) injection 40 mg (has no administration in time range)  acetaminophen  (TYLENOL ) tablet 650 mg (has no administration in time range)    Or  acetaminophen  (TYLENOL ) suppository 650 mg (has no administration in time range)  morphine  (PF) 2 MG/ML injection 2 mg (has no administration in time range)  multivitamin with minerals tablet 1 tablet (has no administration in time range)  morphine  (PF) 2 MG/ML injection 2 mg (2 mg Intravenous Given 12/08/24 0908)  ipratropium-albuterol  (DUONEB) 0.5-2.5 (3) MG/3ML nebulizer solution 3 mL (3 mLs Nebulization Given 12/08/24 0958)  albuterol  (PROVENTIL ) (2.5 MG/3ML) 0.083% nebulizer solution 2.5 mg (2.5 mg Nebulization Given 12/08/24 0958)  methylPREDNISolone  sodium succinate (SOLU-MEDROL ) 125 mg/2 mL injection 125 mg (125 mg Intravenous Given 12/08/24 1003)  magnesium  sulfate IVPB 2 g 50 mL (2 g Intravenous New Bag/Given 12/08/24 1003)                                    Medical Decision Making Amount and/or Complexity of Data Reviewed Labs: ordered. Radiology: ordered.  Risk Prescription  drug management. Decision regarding hospitalization.   Patient with hypoxia and 2 broken ribs.  With COPD exacerbation.  She is admitted to medicine     Final diagnoses:  Hypoxia    ED Discharge Orders     None          Suzette Pac, MD 12/08/24 1726  "

## 2024-12-08 NOTE — ED Triage Notes (Signed)
 Pt biba cc status post fall with left sided 10/10 rib pain and sob, occurring approximately 4.5 hours from this time. Pt states she tripped on an area rub and fell to her side. Denies hitting her head. Left elbow noted with skin tare, bleeding controlled. LS noted rhonchi in middle and upper lobes, expiratory wheezing. Pt only able to speak 1 or 2 word sentences without becoming sob. Pt states onset of sob occurred post fall. Pt denies any further injuries at this time.

## 2024-12-08 NOTE — H&P (Signed)
 " History and Physical    Patient: Deborah Hebert DOB: 05/01/1961 DOA: 12/08/2024 DOS: the patient was seen and examined on 12/08/2024 PCP: [facility] Personal  Patient coming from: Home  Chief Complaint:  Chief Complaint  Patient presents with   Fall    Left sided chest pain   HPI: Deborah Hebert is a 64 y.o. female with medical history significant of COPD, hypertension, hyperlipidemia, presented to ED for fall, left lateral chest pain. She had a fall while feeding her cat, tripped fell on her left side. Since then she has left rib pain, pleuritic, shortness of breath. She denies dizziness, loss of consciousness. No fever, chill, rigors, cough. She lives alone, smokes 1 PPD, occasionally drinks. Denies headache, nausea, abdominal pain.  In the ED, patient was noted to be tachycardic, tachypneic and hypoxic. She is requiring 4L supplemental oxygen to maintain saturation above 90%. She got duonebs, solumedrol, morphine . Chest xray showed mildly displaced left posterolateral eighth and possible nondisplaced fracture of left posterolateral ninth rib, patchy opacities likely atelectasis.  ED provider requested [facility] admission for further management and evaluation of hypoxic respiratory failure, pain control, COPD. Review of Systems: As mentioned in the history of present illness. All other systems reviewed and are negative. Past Medical History:  Diagnosis Date   Hypertension    Past Surgical History:  Procedure Laterality Date   BREAST SURGERY     Social History:  reports that she has been smoking. She has never used smokeless tobacco. She reports that she does not drink alcohol and does not use drugs.  Allergies[1]  History reviewed. No pertinent family history.  Prior to Admission medications  Medication Sig Start Date End Date Taking? Authorizing Provider  atorvastatin  (LIPITOR) 20 MG tablet Take 20 mg by mouth daily. 10/16/21   [provider]  docusate sodium  (COLACE) 100 MG capsule Take 1 tablet once or twice daily as needed for constipation while taking narcotic pain medicine Patient not taking: Reported on 11/10/2021 09/07/15   Gordan Huxley, MD  doxycycline  (VIBRAMYCIN ) 100 MG capsule Take 1 capsule (100 mg total) by mouth 2 (two) times daily. One po bid x 7 days 06/19/22   Mesner, Selinda, MD  hydrochlorothiazide  (HYDRODIURIL ) 12.5 MG tablet Take 12.5 mg by mouth every morning.    [provider]  HYDROcodone -acetaminophen  (NORCO/VICODIN) 5-325 MG tablet Take 1-2 tablets by mouth every 6 (six) hours as needed for moderate pain. Patient not taking: Reported on 11/10/2021 09/07/15   Gordan Huxley, MD  ibuprofen  (ADVIL ,MOTRIN ) 800 MG tablet Take 1 tablet (800 mg total) by mouth 3 (three) times daily. Patient not taking: Reported on 11/10/2021 11/04/14   Armida Culver, PA-C  losartan  (COZAAR ) 50 MG tablet Take 50 mg by mouth daily. 10/15/21   [provider]  naproxen  (NAPROSYN ) 500 MG tablet Take 1 tablet (500 mg total) by mouth 2 (two) times daily with a meal. Patient not taking: Reported on 11/10/2021 05/30/16   Hagler, Jami L, PA-C  ondansetron  (ZOFRAN ) 4 MG tablet Take 1 tablet (4 mg total) by mouth every 8 (eight) hours as needed for nausea or vomiting. 11/10/21   Towana Ozell BROCKS, MD  potassium chloride  SA (KLOR-CON  M) 20 MEQ tablet Take 1 tablet (20 mEq total) by mouth daily. 11/10/21   Towana Ozell BROCKS, MD  predniSONE  (DELTASONE ) 20 MG tablet 3 tabs po day one, then 2 tabs daily x 4 days 06/19/22   Mesner, Selinda, MD  traZODone  (DESYREL ) 50  MG tablet Take 75 mg by mouth at bedtime. 10/15/21   [provider]    Physical Exam: Vitals:   12/08/24 1230 12/08/24 1245 12/08/24 1300 12/08/24 1302  BP: 125/75 131/82 130/71   Pulse: (!) 107 (!) 105 (!) 103   Resp: (!) 22 (!) 23 (!) 23   Temp:    97.9 F (36.6 C)  TempSrc:    Oral  SpO2: 91% 91% 90%   Weight:      Height:       General - Elderly  Caucasian female, moderate respiratory distress HEENT - PERRLA, EOMI, atraumatic head, non tender sinuses. Lung - Clear, basal rales, rhonchi, diffuse wheezes, tachypnea, unable to talk in full sentences. Heart - S1, S2 heard, no murmurs, rubs, no pedal edema. Abdomen - Soft, non tender, bowel sounds good Neuro - Alert, awake and oriented x 3, non focal exam. Skin - Warm and dry. Data Reviewed:     Latest Ref Rng & Units 12/08/2024    8:59 AM 06/18/2022   11:59 PM 11/10/2021    7:36 AM  CBC  WBC 4.0 - 10.5 K/uL 12.1  8.2  11.6   Hemoglobin 12.0 - 15.0 g/dL 84.3  86.2  85.1   Hematocrit 36.0 - 46.0 % 47.2  40.9  41.7   Platelets 150 - 400 K/uL 219  224  263       Latest Ref Rng & Units 12/08/2024    8:59 AM 06/18/2022   11:59 PM 11/10/2021    1:54 PM  BMP  Glucose 70 - 99 mg/dL 96  891  83   BUN 8 - 23 mg/dL 17  12  15    Creatinine 0.44 - 1.00 mg/dL 9.35  9.30  7.83   Sodium 135 - 145 mmol/L 138  139  139   Potassium 3.5 - 5.1 mmol/L 4.8  3.6  2.8   Chloride 98 - 111 mmol/L 97  107  109   CO2 22 - 32 mmol/L 24  23  22    Calcium  8.9 - 10.3 mg/dL 89.9  9.8  7.9    DG Ribs Unilateral W/Chest Left Result Date: 12/08/2024 CLINICAL DATA:  pain EXAM: LEFT RIBS AND CHEST - 3+ VIEW COMPARISON:  December 21, 2023, June 19, 2022 FINDINGS: The cardiomediastinal silhouette is unchanged in contour.Atherosclerotic calcifications no pleural effusion. No pneumothorax. Patchy platelike opacities. There is a mildly displaced fracture of the LEFT posterolateral eighth rib and a possible nondisplaced fracture of the LEFT posterolateral ninth rib. IMPRESSION: 1. Mildly displaced fracture of the LEFT posterolateral eighth rib and possible nondisplaced fracture of the LEFT posterolateral ninth rib. 2. Patchy platelike opacities likely reflect atelectasis. Electronically Signed   By: Corean Salter M.D.   On: 12/08/2024 09:35    Assessment and Plan: Deborah Hebert is a 64 y.o. female with medical  history significant of COPD, hypertension, hyperlipidemia, presented to ED for fall, left lateral chest pain. She had a fall while feeding her cat, tripped fell on her left side.  She sustained left-sided 8th and 9th rib fractures, causing atelectasis and hypoxia.  Patient will need pain control, nebulization treatments, incentive spirometry and gradually wean her oxygen as tolerated.  Plan: Acute hypoxic respiratory failure- In the setting of atelectasis due to trauma, COPD exacerbation. She tachypnic, tachycardic, hypoxic is requiring 4L supplemental O2. Patient will need pain control, incentive spirometry. Continue supplemental oxygen to maintain saturation greater than 92%.  COPD exacerbation: Continue DuoNebs. Patient will be given prednisone   20 mg daily for 3 days. Continue to wean supplemental oxygen as tolerated.  Multiple rib fractures: Status post fall, 8th and 9th left posterolateral rib fractures noted. Encourage incentive spirometry, will continue pain control.  Hypertension: Home medications losartan  resumed.  Hyperlipidemia- Continue Lipitor 20 mg daily.  Chronic smoker: Discussed with her regarding ill effects of smoking. Smoking cessation counseled. Time spent on smoking cessation 5 minutes.   Advance Care Planning:   Code Status: Full Code discussed with family  Consults: none  Family Communication: no family at bedside  Severity of Illness: The appropriate patient status for this patient is INPATIENT. Inpatient status is judged to be reasonable and necessary in order to provide the required intensity of service to ensure the patient's safety. The patient's presenting symptoms, physical exam findings, and initial radiographic and laboratory data in the context of their chronic comorbidities is felt to place them at high risk for further clinical deterioration. Furthermore, it is not anticipated that the patient will be medically stable for discharge from the  hospital within 2 midnights of admission.   * I certify that at the point of admission it is my clinical judgment that the patient will require inpatient hospital care spanning beyond 2 midnights from the point of admission due to high intensity of service, high risk for further deterioration and high frequency of surveillance required.*  Author: Concepcion Riser, MD 12/08/2024 1:12 PM  For on call review [email or web address].      [1] No Known Allergies  "

## 2024-12-08 NOTE — ED Notes (Signed)
 Johnmark RN discussed pt cc with provider at this time.

## 2024-12-09 DIAGNOSIS — J9601 Acute respiratory failure with hypoxia: Secondary | ICD-10-CM | POA: Diagnosis not present

## 2024-12-09 DIAGNOSIS — E782 Mixed hyperlipidemia: Secondary | ICD-10-CM | POA: Diagnosis not present

## 2024-12-09 DIAGNOSIS — J9811 Atelectasis: Secondary | ICD-10-CM | POA: Diagnosis not present

## 2024-12-09 DIAGNOSIS — I10 Essential (primary) hypertension: Secondary | ICD-10-CM | POA: Diagnosis not present

## 2024-12-09 DIAGNOSIS — J441 Chronic obstructive pulmonary disease with (acute) exacerbation: Secondary | ICD-10-CM

## 2024-12-09 DIAGNOSIS — S2242XA Multiple fractures of ribs, left side, initial encounter for closed fracture: Secondary | ICD-10-CM | POA: Diagnosis not present

## 2024-12-09 MED ORDER — IPRATROPIUM-ALBUTEROL 0.5-2.5 (3) MG/3ML IN SOLN
3.0000 mL | Freq: Three times a day (TID) | RESPIRATORY_TRACT | Status: DC
  Administered 2024-12-09 – 2024-12-13 (×14): 3 mL via RESPIRATORY_TRACT
  Filled 2024-12-09 (×15): qty 3

## 2024-12-09 MED ORDER — HYDROMORPHONE HCL 1 MG/ML IJ SOLN
0.5000 mg | INTRAMUSCULAR | Status: DC | PRN
  Administered 2024-12-09 – 2024-12-10 (×8): 0.5 mg via INTRAVENOUS
  Filled 2024-12-09 (×8): qty 0.5

## 2024-12-09 MED ORDER — SACCHAROMYCES BOULARDII 250 MG PO CAPS
250.0000 mg | ORAL_CAPSULE | Freq: Two times a day (BID) | ORAL | Status: AC
  Administered 2024-12-10 – 2024-12-11 (×4): 250 mg via ORAL
  Filled 2024-12-09 (×4): qty 1

## 2024-12-09 MED ORDER — LOPERAMIDE HCL 2 MG PO CAPS
2.0000 mg | ORAL_CAPSULE | ORAL | Status: AC | PRN
  Administered 2024-12-10 (×2): 2 mg via ORAL
  Filled 2024-12-09 (×2): qty 1

## 2024-12-09 NOTE — Progress Notes (Signed)
 Transition of Care Department [facility]) has reviewed patient and no TOC needs have been identified at this time, although noted patient lives alone and is currently on supplemental O2 3L [place]. Inpatient Case Manager will continue to monitor patient advancement through interdisciplinary progression rounds, and will assist with arranging home svcs or DME if needed. If new patient transition needs arise, please place a TOC consult to prompt ICM follow-up.    12/09/24 0857  TOC Brief Assessment  Insurance and Status Reviewed  Patient has primary care physician Yes  Home environment has been reviewed home with self  Prior level of function: independent  Prior/Current Home Services No current home services  Social Drivers of Health Review SDOH reviewed no interventions necessary  Readmission risk has been reviewed Yes  Transition of care needs no transition of care needs at this time

## 2024-12-09 NOTE — Plan of Care (Signed)
" °  Problem: Education: Goal: Knowledge of General Education information will improve Description: Including pain rating scale, medication(s)/side effects and non-pharmacologic comfort measures Outcome: Progressing   Problem: Health Behavior/Discharge Planning: Goal: Ability to manage health-related needs will improve Outcome: Progressing   Problem: Clinical Measurements: Goal: Ability to maintain clinical measurements within normal limits will improve Outcome: Progressing   Problem: Activity: Goal: Risk for activity intolerance will decrease Outcome: Progressing   Problem: Elimination: Goal: Will not experience complications related to bowel motility Outcome: Progressing   Problem: Pain Managment: Goal: General experience of comfort will improve and/or be controlled Outcome: Progressing   Problem: Education: Goal: Knowledge of disease or condition will improve Outcome: Progressing   "

## 2024-12-09 NOTE — Plan of Care (Signed)
   Problem: Clinical Measurements: Goal: Respiratory complications will improve Outcome: Progressing   Problem: Activity: Goal: Risk for activity intolerance will decrease Outcome: Progressing

## 2024-12-10 DIAGNOSIS — I10 Essential (primary) hypertension: Secondary | ICD-10-CM | POA: Diagnosis not present

## 2024-12-10 DIAGNOSIS — E782 Mixed hyperlipidemia: Secondary | ICD-10-CM | POA: Diagnosis not present

## 2024-12-10 DIAGNOSIS — J441 Chronic obstructive pulmonary disease with (acute) exacerbation: Secondary | ICD-10-CM | POA: Diagnosis not present

## 2024-12-10 DIAGNOSIS — J9601 Acute respiratory failure with hypoxia: Secondary | ICD-10-CM | POA: Diagnosis not present

## 2024-12-10 LAB — GLUCOSE, CAPILLARY: Glucose-Capillary: 143 mg/dL — ABNORMAL HIGH (ref 70–99)

## 2024-12-10 MED ORDER — ORAL CARE MOUTH RINSE: 15.0000 mL | OROMUCOSAL | Status: DC | PRN

## 2024-12-10 MED ORDER — ONDANSETRON HCL 4 MG/2ML IJ SOLN
4.0000 mg | Freq: Four times a day (QID) | INTRAMUSCULAR | Status: DC | PRN
  Administered 2024-12-11: 4 mg via INTRAVENOUS
  Filled 2024-12-10: qty 2

## 2024-12-10 MED ORDER — HYDROMORPHONE HCL 1 MG/ML IJ SOLN
1.0000 mg | INTRAMUSCULAR | Status: DC | PRN
  Administered 2024-12-10 – 2024-12-12 (×5): 1 mg via INTRAVENOUS
  Filled 2024-12-10 (×5): qty 1

## 2024-12-10 MED ORDER — OXYCODONE HCL 5 MG PO TABS
5.0000 mg | ORAL_TABLET | ORAL | Status: DC | PRN
  Administered 2024-12-10 – 2024-12-12 (×7): 5 mg via ORAL
  Filled 2024-12-10 (×7): qty 1

## 2024-12-10 NOTE — Progress Notes (Signed)
 " Progress Note   Patient: Deborah Hebert FMW:[id number] DOB: [birth year] DOA: 12/08/2024     2 DOS: the patient was seen and examined on 12/10/2024   Brief hospital course: Deborah Hebert is a 64 y.o. female with medical history significant of COPD, hypertension, hyperlipidemia, presented to ED for fall, left lateral chest pain. She had a fall while feeding her cat, tripped fell on her left side.  She sustained left-sided 8th and 9th rib fractures, causing atelectasis and hypoxia.  Patient will need pain control, nebulization treatments, incentive spirometry and gradually wean her oxygen as tolerated.   Assessment and Plan: Acute hypoxic respiratory failure- In the setting of atelectasis due to left rib fractures, COPD exacerbation. She is still tachypnea, tachycardia, in pain, hypoxic was requiring 3L supplemental O2.  Continue to wean supplemental oxygen. Continue pain control with IV Dilaudid  for severe pain, oxycodone  for moderate pain.  Encourage incentive spirometry, out of bed to chair, PT/ OT advised HH. Continue supplemental oxygen to maintain saturation greater than 92%.   COPD exacerbation: Continue DuoNebs. Continue prednisone  20 mg daily for 3 days. Continue to wean supplemental oxygen as tolerated.   Multiple rib fractures: Status post fall, 8th and 9th left posterolateral rib fractures noted. Encourage incentive spirometry, pain control with IV opiates.   Hypertension: BP control, losartan  resumed.   Hyperlipidemia- Continue Lipitor 20 mg daily.   Chronic smoker: Discussed with her regarding ill effects of smoking. Smoking cessation counseled.  Obesity Class I- BMI 31.17 Diet, exercise and weight reduction advised.     Out of bed to chair. Incentive spirometry. Nursing supportive care. Fall, aspiration precautions. Diet:  Diet Orders (From admission, onward)     Start     Ordered   12/08/24 1240  Diet Heart Room service appropriate? Yes; Fluid  consistency: Thin  Diet effective now       Question Answer Comment  Room service appropriate? Yes   Fluid consistency: Thin      12/08/24 1241           DVT prophylaxis: Place and maintain sequential compression device Start: 12/09/24 1054 enoxaparin  (LOVENOX ) injection 40 mg Start: 12/08/24 1400 SCDs Start: 12/08/24 1238  Level of care: Telemetry   Code Status: Full Code  Subjective: Patient is seen and examined today morning.  Her pain persists, encouraged incentive spirometry.  Did get out of bed, worked with PT.  Eating fair.  Physical Exam: Vitals:   12/10/24 0438 12/10/24 0718 12/10/24 0830 12/10/24 1145  BP: (!) 136/97  126/84 127/88  Pulse: (!) 108  (!) 124 (!) 108  Resp: 20  18 18   Temp: (!) 97.5 F (36.4 C)     TempSrc: Oral     SpO2: (!) 89% 92% 92% (!) 89%  Weight:      Height:        General - Elderly Caucasian ill female, distress due to severe pain HEENT - PERRLA, EOMI, atraumatic head, non tender sinuses. Lung - Clear, basal rales, diffuse wheezes, left chest wall tenderness Heart - S1, S2 heard, no murmurs, rubs, no pedal edema. Abdomen - Soft, non tender, bowel sounds good Neuro - Alert, awake and oriented x 3, non focal exam. Skin - Warm and dry.  Data Reviewed:      Latest Ref Rng & Units 12/08/2024    8:59 AM 06/18/2022   11:59 PM 11/10/2021    7:36 AM  CBC  WBC 4.0 - 10.5 K/uL 12.1  8.2  11.6  Hemoglobin 12.0 - 15.0 g/dL 84.3  86.2  85.1   Hematocrit 36.0 - 46.0 % 47.2  40.9  41.7   Platelets 150 - 400 K/uL 219  224  263       Latest Ref Rng & Units 12/08/2024    8:59 AM 06/18/2022   11:59 PM 11/10/2021    1:54 PM  BMP  Glucose 70 - 99 mg/dL 96  891  83   BUN 8 - 23 mg/dL 17  12  15    Creatinine 0.44 - 1.00 mg/dL 9.35  9.30  7.83   Sodium 135 - 145 mmol/L 138  139  139   Potassium 3.5 - 5.1 mmol/L 4.8  3.6  2.8   Chloride 98 - 111 mmol/L 97  107  109   CO2 22 - 32 mmol/L 24  23  22    Calcium  8.9 - 10.3 mg/dL 89.9  9.8  7.9    No  results found.   Family Communication: Discussed with patient, understand and agree. All questions answered.  Disposition: Status is: Inpatient Remains inpatient appropriate because: Wean oxygen, pain control, steroids  Planned Discharge Destination: Home with Home Health     Time spent: 44 minutes  Author: Concepcion Riser, MD 12/10/2024 12:32 PM Secure chat 7am to 7pm For on call review [email or web address]    "

## 2024-12-10 NOTE — Plan of Care (Signed)

## 2024-12-10 NOTE — Evaluation (Signed)
 Physical Therapy Evaluation Patient Details Name: Deborah Hebert MRN: [id number] DOB: 02-19-1961 Today's Date: 12/10/2024  History of Present Illness  Deborah Hebert is a 64 y.o. female with medical history significant of COPD, hypertension, hyperlipidemia, presented to ED for fall, left lateral chest pain. She had a fall while feeding her cat, tripped fell on her left side. Since then she has left rib pain, pleuritic, shortness of breath. She denies dizziness, loss of consciousness. No fever, chill, rigors, cough. She lives alone, smokes 1 PPD, occasionally drinks. Denies headache, nausea, abdominal pain.     In the ED, patient was noted to be tachycardic, tachypneic and hypoxic. She is requiring 4L supplemental oxygen to maintain saturation above 90%. She got duonebs, solumedrol, morphine . Chest xray showed mildly displaced left posterolateral eighth and possible nondisplaced fracture of left posterolateral ninth rib, patchy opacities likely atelectasis.  ED provider requested [facility] admission for further management and evaluation of hypoxic respiratory failure, pain control, COPD.    Clinical Impression  On therapist arrival; patient is lying in bed.  She is pleasant and agreeable to therapist assessment. Shortly after PT arrives; patient's daughter and other family arrive.  Patient performs supine to sit with min A; quite painful with movement.  She is able to sit and breathe to try to control pain levels.  Patient performs sit to stand with min A to RW and then ambulates x 20 ft with CGA.  She returns to bed and needs min/mod A for her legs to return to supine.  PT encouraged use of incentive spirometer and patient able to demonstrate correct use.  She is on 3 L of oxygen and her O2 saturation levels were from 89% to 91% throughout treatment. Patient left in bed with call button in reach, bed alarm set, family at bedside  and nursing notified of mobility status. Patient will benefit from continued  skilled therapy services during the remainder of her hospital stay and at the next recommended venue of care to address deficits and promote return to optimal function.            If plan is discharge home, recommend the following: A little help with walking and/or transfers;A little help with bathing/dressing/bathroom;Help with stairs or ramp for entrance;Assistance with cooking/housework   Can travel by private vehicle        Equipment Recommendations Rolling walker (2 wheels)  Recommendations for Other Services       Functional Status Assessment Patient has had a recent decline in their functional status and demonstrates the ability to make significant improvements in function in a reasonable and predictable amount of time.     Precautions / Restrictions Precautions Precautions: Fall Recall of Precautions/Restrictions: Intact Restrictions Weight Bearing Restrictions Per Provider Order: No      Mobility  Bed Mobility Overal bed mobility: Needs Assistance Bed Mobility: Supine to Sit     Supine to sit: HOB elevated, Used rails, Min assist     General bed mobility comments: slow; painful    Transfers Overall transfer level: Needs assistance Equipment used: Rolling walker (2 wheels) Transfers: Sit to/from Stand Sit to Stand: Min assist           General transfer comment: slow and painful    Ambulation/Gait Ambulation/Gait assistance: Min assist, Contact guard assist Gait Distance (Feet): 20 Feet Assistive device: Rolling walker (2 wheels) Gait Pattern/deviations: Decreased step length - right, Decreased step length - left, Antalgic Gait velocity: decreased  Stairs            Wheelchair Mobility     Tilt Bed    Modified Rankin (Stroke Patients Only)       Balance Overall balance assessment: Needs assistance Sitting-balance support: Feet supported, Bilateral upper extremity supported Sitting balance-Leahy Scale: Good Sitting  balance - Comments: fair to good sitting balance; initially needed assistance due to pain but improved as she continued to sit and breathe through the pain   Standing balance support: Bilateral upper extremity supported, Reliant on assistive device for balance, During functional activity Standing balance-Leahy Scale: Good Standing balance comment: good standing balance with RW and PT CGA for safety                             Pertinent Vitals/Pain Pain Assessment Pain Assessment: 0-10 Pain Score: 8  Pain Location: ribs Pain Descriptors / Indicators: Sore, Tender, Stabbing Pain Intervention(s): Monitored during session, Repositioned    Home Living Family/patient expects to be discharged to:: Private residence Living Arrangements: Alone Available Help at Discharge: Family;Available PRN/intermittently Type of Home: House Home Access: Stairs to enter Entrance Stairs-Rails: Right;Left;Can reach both Entrance Stairs-Number of Steps: 3   Home Layout: One level Home Equipment: None      Prior Function Prior Level of Function : Independent/Modified Independent               ADLs Comments: drives; independent with bathing, dressing shopping cooking and cleaning     Extremity/Trunk Assessment   Upper Extremity Assessment Upper Extremity Assessment: Generalized weakness    Lower Extremity Assessment Lower Extremity Assessment: Generalized weakness    Cervical / Trunk Assessment Cervical / Trunk Assessment: Normal  Communication   Communication Communication: No apparent difficulties    Cognition Arousal: Alert Behavior During Therapy: WFL for tasks assessed/performed   PT - Cognitive impairments: No apparent impairments                         Following commands: Intact       Cueing       General Comments      Exercises     Assessment/Plan    PT Assessment Patient needs continued PT services  PT Problem List Decreased  strength;Decreased activity tolerance;Decreased balance;Decreased mobility;Pain       PT Treatment Interventions Gait training;DME instruction;Stair training;Functional mobility training;Therapeutic activities;Therapeutic exercise;Balance training    PT Goals (Current goals can be found in the Care Plan section)  Acute Rehab PT Goals Patient Stated Goal: return home PT Goal Formulation: With patient/family Time For Goal Achievement: 12/24/24 Potential to Achieve Goals: Good    Frequency Min 2X/week     Co-evaluation               AM-PAC PT 6 Clicks Mobility  Outcome Measure Help needed turning from your back to your side while in a flat bed without using bedrails?: A Little Help needed moving from lying on your back to sitting on the side of a flat bed without using bedrails?: A Little Help needed moving to and from a bed to a chair (including a wheelchair)?: A Little Help needed standing up from a chair using your arms (e.g., wheelchair or bedside chair)?: A Little Help needed to walk in hospital room?: A Little Help needed climbing 3-5 steps with a railing? : A Lot 6 Click Score: 17    End of Session Equipment Utilized  During Treatment: Gait belt Activity Tolerance: Patient limited by pain Patient left: in bed;with family/visitor present;with call bell/phone within reach;with bed alarm set Nurse Communication: Mobility status PT Visit Diagnosis: Other abnormalities of gait and mobility (R26.89);Muscle weakness (generalized) (M62.81);History of falling (Z91.81);Difficulty in walking, not elsewhere classified (R26.2);Pain Pain - part of body:  (ribs)    Time: 1000-1022 PT Time Calculation (min) (ACUTE ONLY): 22 min   Charges:   PT Evaluation $PT Eval Moderate Complexity: 1 Mod   PT General Charges $$ ACUTE PT VISIT: 1 Visit        11:06 AM, 12/10/24 Deborah Hebert Deborah Hebert MPT [facility] [place] [phone] Ph:[phone]

## 2024-12-10 NOTE — Plan of Care (Signed)

## 2024-12-11 ENCOUNTER — Inpatient Hospital Stay (HOSPITAL_COMMUNITY)

## 2024-12-11 DIAGNOSIS — I10 Essential (primary) hypertension: Secondary | ICD-10-CM | POA: Diagnosis not present

## 2024-12-11 DIAGNOSIS — J441 Chronic obstructive pulmonary disease with (acute) exacerbation: Secondary | ICD-10-CM | POA: Diagnosis not present

## 2024-12-11 DIAGNOSIS — J9601 Acute respiratory failure with hypoxia: Secondary | ICD-10-CM | POA: Diagnosis not present

## 2024-12-11 DIAGNOSIS — E782 Mixed hyperlipidemia: Secondary | ICD-10-CM | POA: Diagnosis not present

## 2024-12-11 MED ORDER — BISMUTH SUBSALICYLATE 262 MG/15ML PO SUSP
30.0000 mL | Freq: Three times a day (TID) | ORAL | Status: DC
  Administered 2024-12-11 – 2024-12-12 (×3): 30 mL via ORAL
  Filled 2024-12-11: qty 118

## 2024-12-11 MED ORDER — LOPERAMIDE HCL 2 MG PO CAPS: 2.0000 mg | ORAL_CAPSULE | ORAL | Status: DC | PRN

## 2024-12-11 MED ORDER — METOCLOPRAMIDE HCL 5 MG/ML IJ SOLN
10.0000 mg | Freq: Three times a day (TID) | INTRAMUSCULAR | Status: DC
  Administered 2024-12-11 – 2024-12-13 (×7): 10 mg via INTRAVENOUS
  Filled 2024-12-11 (×7): qty 2

## 2024-12-11 MED ORDER — SODIUM CHLORIDE 0.9 % IV SOLN: 12.5000 mg | Freq: Four times a day (QID) | INTRAVENOUS | Status: DC | PRN

## 2024-12-11 NOTE — Progress Notes (Signed)
 Mobility Specialist Progress Note:    12/11/24 0935  Mobility  Activity Ambulated with assistance  Level of Assistance Contact guard assist, steadying assist  Assistive Device Front wheel walker  Distance Ambulated (ft) 50 ft  Range of Motion/Exercises Active;All extremities  Activity Response Tolerated well  Mobility Referral Yes  Mobility visit 1 Mobility  Mobility Specialist Start Time (ACUTE ONLY) 0935  Mobility Specialist Stop Time (ACUTE ONLY) 0955  Mobility Specialist Time Calculation (min) (ACUTE ONLY) 20 min   Pt received in bed, agreeable to mobility. Required CGA to stand and ambulate with RW. Tolerated well, SpO2 88% on 3L at rest. Asked pt to take some deep breaths and O2 came up to 90% on 3L at rest. During ambulation SpO2 dropped to 87% on 3L, failed to increase with deep breathing and bumped up to 4L and SpO2 failed to rise above 88%. Bumped O2 up to 6L and SpO2 91% during ambulation. Pt also states she usually breathes through her mouth instead of nose, left in chair. Call bell in reach, all needs met.  Deborah Hebert Mobility Specialist Please contact via Special Educational Needs Teacher or  Rehab office at [phone]

## 2024-12-11 NOTE — Plan of Care (Signed)
°  Problem: Education: °Goal: Knowledge of General Education information will improve °Description: Including pain rating scale, medication(s)/side effects and non-pharmacologic comfort measures °Outcome: Progressing °  °Problem: Clinical Measurements: °Goal: Cardiovascular complication will be avoided °Outcome: Progressing °  °Problem: Activity: °Goal: Risk for activity intolerance will decrease °Outcome: Progressing °  °

## 2024-12-11 NOTE — Plan of Care (Signed)

## 2024-12-12 ENCOUNTER — Inpatient Hospital Stay (HOSPITAL_COMMUNITY)

## 2024-12-12 DIAGNOSIS — J9601 Acute respiratory failure with hypoxia: Secondary | ICD-10-CM | POA: Diagnosis not present

## 2024-12-12 LAB — CBC
HCT: 44.2 % (ref 36.0–46.0)
HCT: 42.8 % (ref 36.0–46.0)
Hemoglobin: 14.2 g/dL (ref 12.0–15.0)
Hemoglobin: 13.8 g/dL (ref 12.0–15.0)
MCH: 32.6 pg (ref 26.0–34.0)
MCH: 32.5 pg (ref 26.0–34.0)
MCHC: 32.1 g/dL (ref 30.0–36.0)
MCHC: 32.2 g/dL (ref 30.0–36.0)
MCV: 101.4 fL — ABNORMAL HIGH (ref 80.0–100.0)
MCV: 100.9 fL — ABNORMAL HIGH (ref 80.0–100.0)
Platelets: 193 10*3/uL (ref 150–400)
Platelets: 197 10*3/uL (ref 150–400)
RBC: 4.36 MIL/uL (ref 3.87–5.11)
RBC: 4.24 MIL/uL (ref 3.87–5.11)
RDW: 13.2 % (ref 11.5–15.5)
RDW: 13.3 % (ref 11.5–15.5)
WBC: 8.8 10*3/uL (ref 4.0–10.5)
WBC: 10 10*3/uL (ref 4.0–10.5)
nRBC: 0 % (ref 0.0–0.2)
nRBC: 0 % (ref 0.0–0.2)

## 2024-12-12 LAB — BASIC METABOLIC PANEL WITH GFR
Anion gap: 15 (ref 5–15)
BUN: 31 mg/dL — ABNORMAL HIGH (ref 8–23)
CO2: 23 mmol/L (ref 22–32)
Calcium: 10.2 mg/dL (ref 8.9–10.3)
Chloride: 90 mmol/L — ABNORMAL LOW (ref 98–111)
Creatinine, Ser: 1.2 mg/dL — ABNORMAL HIGH (ref 0.44–1.00)
GFR, Estimated: 51 mL/min — ABNORMAL LOW
Glucose, Bld: 96 mg/dL (ref 70–99)
Potassium: 3.7 mmol/L (ref 3.5–5.1)
Sodium: 128 mmol/L — ABNORMAL LOW (ref 135–145)

## 2024-12-12 LAB — D-DIMER, QUANTITATIVE: D-Dimer, Quant: 0.74 ug{FEU}/mL — ABNORMAL HIGH (ref 0.00–0.50)

## 2024-12-12 MED ORDER — IOHEXOL 350 MG/ML SOLN
75.0000 mL | Freq: Once | INTRAVENOUS | Status: AC | PRN
  Administered 2024-12-12: 75 mL via INTRAVENOUS

## 2024-12-12 MED ORDER — HYDROMORPHONE HCL 1 MG/ML IJ SOLN
1.0000 mg | INTRAMUSCULAR | Status: DC | PRN
  Administered 2024-12-12 – 2024-12-13 (×2): 1 mg via INTRAVENOUS
  Filled 2024-12-12 (×2): qty 1

## 2024-12-12 MED ORDER — OXYCODONE HCL 5 MG PO TABS
5.0000 mg | ORAL_TABLET | ORAL | Status: DC | PRN
  Administered 2024-12-12: 5 mg via ORAL
  Administered 2024-12-13: 10 mg via ORAL
  Filled 2024-12-12: qty 2
  Filled 2024-12-12: qty 1

## 2024-12-12 MED ORDER — ACETAMINOPHEN 500 MG PO TABS
1000.0000 mg | ORAL_TABLET | Freq: Three times a day (TID) | ORAL | Status: DC
  Administered 2024-12-12 – 2024-12-13 (×4): 1000 mg via ORAL
  Filled 2024-12-12 (×4): qty 2

## 2024-12-12 MED ORDER — SODIUM CHLORIDE 0.9 % IV SOLN: INTRAVENOUS | Status: DC

## 2024-12-12 MED ORDER — LIDOCAINE 5 % EX PTCH
1.0000 | MEDICATED_PATCH | CUTANEOUS | Status: DC
  Administered 2024-12-12 – 2024-12-13 (×2): 1 via TRANSDERMAL
  Filled 2024-12-12 (×2): qty 1

## 2024-12-12 NOTE — Progress Notes (Signed)
 Mobility Specialist Progress Note:    12/12/24 1402  Mobility  Activity Ambulated with assistance  Level of Assistance Standby assist, set-up cues, supervision of patient - no hands on  Assistive Device Front wheel walker  Distance Ambulated (ft) 15 ft  Range of Motion/Exercises Active;All extremities  Activity Response Tolerated well  Mobility Referral Yes  Mobility visit 1 Mobility  Mobility Specialist Start Time (ACUTE ONLY) 1402  Mobility Specialist Stop Time (ACUTE ONLY) 1418  Mobility Specialist Time Calculation (min) (ACUTE ONLY) 16 min   Pt received in bed, requesting assistance to bathroom. Required SBA to stand and ambulate with RW. Tolerated well, left in chair. Call bell in reach, all needs met.  Lilianna Case Mobility Specialist Please contact via Special Educational Needs Teacher or  Rehab office at [phone]

## 2024-12-12 NOTE — Progress Notes (Signed)
 " Progress Note   Patient: Deborah Hebert FMW:[id number] DOB: 1961-05-05 DOA: 12/08/2024     4 DOS: the patient was seen and examined on 12/12/2024   Brief hospital course: Deborah Hebert is a 64 y.o. female with medical history significant of COPD, hypertension, hyperlipidemia, presented to ED for fall, left lateral chest pain. She had a fall while feeding her cat, tripped fell on her left side.  She sustained left-sided 8th and 9th rib fractures, causing atelectasis and hypoxia.  Patient admitted for pain control, nebulization treatments, incentive spirometry and wean oxygen as tolerated.   Assessment and Plan: Acute hypoxic respiratory failure- In the setting of atelectasis due to left rib fractures, COPD exacerbation. - Continue to wean supplemental oxygen as able but suspect patient will need to be d/c'd on O2 Continue pain control -- added scheduled tylenol , try to limit dilaudid  -  Encourage incentive spirometry, out of bed to chair, PT/ OT advised HH. -will get d dimer and if elevated, will get CTA of chest   COPD exacerbation: Continue DuoNebs. Finished  prednisone  20 mg. Continue to wean supplemental oxygen as tolerated.   Multiple rib fractures: Status post fall, 8th and 9th left posterolateral rib fractures noted. Encourage incentive spirometry, pain control with opiates. -lidocaine  patch ordered   Hypertension: -BP lower end of normal, hold losartan    Hyperlipidemia- Continue Lipitor 20 mg daily.   Chronic smoker: Discussed with her regarding ill effects of smoking. Smoking cessation counseled.  - refused nicotine patch.  Obesity Class I- Estimated body mass index is 31.17 kg/m as calculated from the following:   Height as of this encounter: 5' (1.524 m).   Weight as of this encounter: 72.4 kg.      Out of bed to chair. Incentive spirometry.   DVT prophylaxis: Place and maintain sequential compression device Start: 12/09/24 1054 enoxaparin  (LOVENOX )  injection 40 mg Start: 12/08/24 1400 SCDs Start: 12/08/24 1238  Level of care: Telemetry   Code Status: Full Code  Subjective: Patient is seen and examined today morning.  Her pain better, advised out of bed, encouraged incentive spirometry.  Did ambulate, requiring 6L to maintain sat >92%.  Physical Exam: Vitals:   12/11/24 1958 12/11/24 2046 12/12/24 0521 12/12/24 0748  BP:  106/67 97/68   Pulse:  (!) 123    Resp:   20   Temp:  98 F (36.7 C) 98.1 F (36.7 C)   TempSrc:  Oral Oral   SpO2: 93% 93% 93% 94%  Weight:      Height:         General: Appearance:    Obese female in no acute distress     Lungs:     On 5L, diminished- no increased work of breathing  Heart:    Tachycardic.   MS:   All extremities are intact.   Neurologic:   Awake, alert, oriented x 3. No apparent focal neurological           defect.      Data Reviewed:      Latest Ref Rng & Units 12/12/2024    4:16 AM 12/08/2024    8:59 AM 06/18/2022   11:59 PM  CBC  WBC 4.0 - 10.5 K/uL 8.8  12.1  8.2   Hemoglobin 12.0 - 15.0 g/dL 85.7  84.3  86.2   Hematocrit 36.0 - 46.0 % 44.2  47.2  40.9   Platelets 150 - 400 K/uL 193  219  224  Latest Ref Rng & Units 12/08/2024    8:59 AM 06/18/2022   11:59 PM 11/10/2021    1:54 PM  BMP  Glucose 70 - 99 mg/dL 96  891  83   BUN 8 - 23 mg/dL 17  12  15    Creatinine 0.44 - 1.00 mg/dL 9.35  9.30  7.83   Sodium 135 - 145 mmol/L 138  139  139   Potassium 3.5 - 5.1 mmol/L 4.8  3.6  2.8   Chloride 98 - 111 mmol/L 97  107  109   CO2 22 - 32 mmol/L 24  23  22    Calcium  8.9 - 10.3 mg/dL 89.9  9.8  7.9    DG Chest 2 View Result Date: 12/11/2024 CLINICAL DATA:  Hypoxia.  Recent fall with left rib fractures. EXAM: CHEST - 2 VIEW COMPARISON:  Rib radiographs 12/08/2024 FINDINGS: Patient is rotated. Lung volumes are low. Hazy opacity of both lung bases likely atelectasis and small pleural effusions. Stable heart size and mediastinal contours. Left lateral rib fracture with  question of increased displacement from prior exam. No pneumothorax. IMPRESSION: 1. Hazy opacity of both lung bases likely atelectasis and small pleural effusions. 2. Left lateral rib fracture with question of increased displacement from prior exam. Electronically Signed   By: Andrea Gasman M.D.   On: 12/11/2024 20:23     Family Communication: Discussed with patient, understand and agree. All questions answered.  Disposition: Status is: Inpatient Remains inpatient appropriate because: Wean oxygen, pain control  Planned Discharge Destination: Home with Home Health--- suspect will need O2 at D/C as well Time spent: 45 minutes    Author: Britt Petroni U Deborah Fulmore, DO 12/12/2024 10:10 AM Secure chat 7am to 7pm For on call review [email or web address].    "

## 2024-12-12 NOTE — Plan of Care (Signed)
   Problem: Education: Goal: Knowledge of General Education information will improve Description: Including pain rating scale, medication(s)/side effects and non-pharmacologic comfort measures Outcome: Progressing   Problem: Activity: Goal: Risk for activity intolerance will decrease Outcome: Progressing   Problem: Nutrition: Goal: Adequate nutrition will be maintained Outcome: Progressing

## 2024-12-12 NOTE — Progress Notes (Signed)
 Mobility Specialist Progress Note:    12/12/24 0920  Mobility  Activity Ambulated with assistance  Level of Assistance Standby assist, set-up cues, supervision of patient - no hands on  Assistive Device Front wheel walker  Distance Ambulated (ft) 50 ft  Range of Motion/Exercises Active;All extremities  Activity Response Tolerated well  Mobility Referral Yes  Mobility visit 1 Mobility  Mobility Specialist Start Time (ACUTE ONLY) 0920  Mobility Specialist Stop Time (ACUTE ONLY) 0940  Mobility Specialist Time Calculation (min) (ACUTE ONLY) 20 min   Pt received in chair, agreeable to mobility. Required SBA to stand and ambulate with RW. Tolerated well, stated it hurts to take deep breaths d/t rib pain. SpO2 91% on 5L at rest, put on RA for 1 min and SpO2 was 88%. Ambulated on 6L and SpO2 89-91%, returned to chair. All needs met.  Joelyn Lover Mobility Specialist Please contact via Special Educational Needs Teacher or  Rehab office at [phone]

## 2024-12-12 NOTE — Plan of Care (Signed)

## 2024-12-13 DIAGNOSIS — J9601 Acute respiratory failure with hypoxia: Secondary | ICD-10-CM | POA: Diagnosis not present

## 2024-12-13 LAB — CBC
HCT: 41.5 % (ref 36.0–46.0)
Hemoglobin: 13.5 g/dL (ref 12.0–15.0)
MCH: 32.9 pg (ref 26.0–34.0)
MCHC: 32.5 g/dL (ref 30.0–36.0)
MCV: 101.2 fL — ABNORMAL HIGH (ref 80.0–100.0)
Platelets: 174 10*3/uL (ref 150–400)
RBC: 4.1 MIL/uL (ref 3.87–5.11)
RDW: 13 % (ref 11.5–15.5)
WBC: 7.8 10*3/uL (ref 4.0–10.5)
nRBC: 0 % (ref 0.0–0.2)

## 2024-12-13 LAB — BASIC METABOLIC PANEL WITH GFR
Anion gap: 8 (ref 5–15)
BUN: 22 mg/dL (ref 8–23)
CO2: 30 mmol/L (ref 22–32)
Calcium: 10.3 mg/dL (ref 8.9–10.3)
Chloride: 96 mmol/L — ABNORMAL LOW (ref 98–111)
Creatinine, Ser: 0.83 mg/dL (ref 0.44–1.00)
GFR, Estimated: >60 mL/min
Glucose, Bld: 81 mg/dL (ref 70–99)
Potassium: 4.4 mmol/L (ref 3.5–5.1)
Sodium: 134 mmol/L — ABNORMAL LOW (ref 135–145)

## 2024-12-13 MED ORDER — OXYCODONE HCL 5 MG PO TABS: 5.0000 mg | ORAL_TABLET | ORAL | 0 refills | Status: AC | PRN

## 2024-12-13 MED ORDER — ACETAMINOPHEN 500 MG PO TABS: 1000.0000 mg | ORAL_TABLET | Freq: Three times a day (TID) | ORAL | Status: AC

## 2024-12-13 MED ORDER — ONDANSETRON HCL 4 MG PO TABS: 4.0000 mg | ORAL_TABLET | Freq: Three times a day (TID) | ORAL | 0 refills | Status: AC | PRN

## 2024-12-13 MED ORDER — LIDOCAINE 5 % EX PTCH: 1.0000 | MEDICATED_PATCH | CUTANEOUS | Status: AC

## 2024-12-13 NOTE — Progress Notes (Signed)
 SATURATION QUALIFICATIONS: (This note is used to comply with regulatory documentation for home oxygen)  Patient Saturations on Room Air at Rest = 88%  Patient Saturations on Room Air while Ambulating = 86%  Patient Saturations on 2 Liters of oxygen while Ambulating = 92%  Please briefly explain why patient needs home oxygen:  Patient becomes very short of breath while ambulating. While on room air complaints lightheadedness, but after oxygen was given this improved.

## 2024-12-13 NOTE — TOC Transition Note (Addendum)
 Transition of Care [facility]) - Discharge Note   Patient Details  Name: Izumi Mixon MRN: [id number] Date of Birth: 10-05-1961  [facility] CM/SW Contact:  Lucie Lunger, LCSWA Phone Number: 12/13/2024, 12:52 PM   Clinical Narrative:    CSW updated that pt will need home O2 arranged at D/C. CSW spoke with pt at bedside to review, pt does not have an agency preference. CSW spoke to Zack with [employer] who states that they are in network and will work on delivery of O2. OP PT arranged at [facility] clinic at pts request due to no [facility] agencies able to accept pt. TOC signing off.   Final next level of care: Home/Self Care Barriers to Discharge: Barriers Resolved   Patient Goals and CMS Choice Patient states their goals for this hospitalization and ongoing recovery are:: return home CMS Medicare.gov Compare Post Acute Care list provided to:: Patient Choice offered to / list presented to : Patient [place] ownership interest in [facility].provided to:: Patient    Discharge Placement                       Discharge Plan and Services Additional resources added to the After Visit Summary for                  DME Arranged: Oxygen DME Agency: [employer] Date DME Agency Contacted: 12/13/24   Representative spoke with at DME Agency: Zack            Social Drivers of Health (SDOH) Interventions SDOH Screenings   Food Insecurity: No Food Insecurity (12/08/2024)  Housing: Low Risk (12/08/2024)  Transportation Needs: No Transportation Needs (12/08/2024)  Utilities: Not At Risk (12/08/2024)  Tobacco Use: High Risk (12/08/2024)     Readmission Risk Interventions    12/10/2024    6:02 PM  Readmission Risk Prevention Plan  Post Dischage Appt Not Complete  Medication Screening Complete  Transportation Screening Complete

## 2024-12-13 NOTE — Progress Notes (Signed)
 Mobility Specialist Progress Note:    12/13/24 1020  Mobility  Activity Ambulated with assistance  Level of Assistance Independent  Assistive Device Front wheel walker  Distance Ambulated (ft) 140 ft  Range of Motion/Exercises Active;All extremities  Activity Response Tolerated well  Mobility Referral Yes  Mobility visit 1 Mobility  Mobility Specialist Start Time (ACUTE ONLY) 1020  Mobility Specialist Stop Time (ACUTE ONLY) 1035  Mobility Specialist Time Calculation (min) (ACUTE ONLY) 15 min   Pt received coming out of the bathroom. Independently able to stand and ambulate with RW. Tolerated well, SpO2 92% on 3L at rest and SpO2 90% on 3L during ambulation. Left in chair, call bell in reach. All needs met.  Mitcheal Sweetin Mobility Specialist Please contact via Special Educational Needs Teacher or  Rehab office at [phone]

## 2024-12-13 NOTE — Plan of Care (Signed)
  Problem: Activity: Goal: Risk for activity intolerance will decrease Outcome: Progressing   Problem: Pain Managment: Goal: General experience of comfort will improve and/or be controlled Outcome: Progressing   Problem: Safety: Goal: Ability to remain free from injury will improve Outcome: Progressing   Problem: Activity: Goal: Ability to tolerate increased activity will improve Outcome: Progressing

## 2024-12-13 NOTE — Progress Notes (Signed)
 Mobility Specialist Progress Note:    12/13/24 1100  Mobility  Activity Ambulated with assistance  Level of Assistance Independent  Assistive Device Front wheel walker  Distance Ambulated (ft) 120 ft  Range of Motion/Exercises Active;All extremities  Activity Response Tolerated well  Mobility Referral Yes  Mobility visit 1 Mobility  Mobility Specialist Start Time (ACUTE ONLY) 1100  Mobility Specialist Stop Time (ACUTE ONLY) 1118  Mobility Specialist Time Calculation (min) (ACUTE ONLY) 18 min   Pt received in chair, DO requesting O2 test. Independently able to stand and ambulate with RW. Tolerated well, SpO2 88% on RA at rest. During ambulation SpO2 dropped to 86% on RA and c/o of SOB and lightheadedness. O2 did not recover with a rest break, required 2L to bring SpO2 up to 92% during ambulation. Returned to chair, all needs met.  Saamiya Jeppsen Mobility Specialist Please contact via Special Educational Needs Teacher or  Rehab office at [phone]

## 2024-12-13 NOTE — Discharge Summary (Signed)
 "     Physician Discharge Summary  Deborah Hebert FMW:[id number] DOB: 1961/02/14 DOA: 12/08/2024  PCP: [facility]  Admit date: 12/08/2024 Discharge date: 12/13/2024  Admitted From:  Discharge disposition: Home   Recommendations for Outpatient Follow-Up:   Suspect as patient's rib fractures heal, she will be able to be weaned off oxygen Smoking cessation (patient knows she cannot smoke while wearing oxygen) Continue incentive spirometry aggressively to help resolve atelectasis Home O2 2 L-again suspect can be weaned outpatient   Discharge Diagnosis:   Principal Problem:   Acute hypoxic respiratory failure (HCC) Active Problems:   Multiple rib fractures   Atelectasis due to trauma   COPD with acute exacerbation (HCC)   Essential hypertension   Hyperlipidemia    Discharge Condition: Improved.  Diet recommendation: Low sodium, heart healthy  Wound care: None.  Code status: Full.   History of Present Illness:   Deborah Hebert is a 64 y.o. female with medical history significant of COPD, hypertension, hyperlipidemia, presented to ED for fall, left lateral chest pain. She had a fall while feeding her cat, tripped fell on her left side.  She sustained left-sided 8th and 9th rib fractures, causing atelectasis and hypoxia.  Patient admitted for pain control, nebulization treatments, incentive spirometry and wean oxygen as tolerated.      Hospital Course by Problem:   Acute hypoxic respiratory failure- In the setting of atelectasis due to left rib fractures, COPD exacerbation. - Continue to wean supplemental oxygen as able but suspect patient will need to be d/c'd on O2 Continue pain control -- added scheduled tylenol , try to limit dilaudid  -  Encourage incentive spirometry, out of bed to chair-outpatient PT -CTA ruled out PE and showed atelectasis-patient has been using her incentive spirometer more and has been weaned down to 2 L from  5   COPD exacerbation: Continue DuoNebs. Finished  prednisone  20 mg. Continue to wean supplemental oxygen as tolerated.   Multiple rib fractures: Status post fall, 8th and 9th left posterolateral rib fractures noted. Encourage incentive spirometry, pain control with opiates. -lidocaine  patch ordered   Hypertension: -BP lower end of normal, hold losartan -defer to PCP to resume   Hyperlipidemia- Continue Lipitor 20 mg daily.   Chronic smoker: Discussed with her regarding ill effects of smoking. Smoking cessation counseled.  - refused nicotine patch.   Obesity Class I- Estimated body mass index is 31.17 kg/m as calculated from the following:   Height as of this encounter: 5' (1.524 m).   Weight as of this encounter: 72.4 kg.     Medical Consultants:      Discharge Exam:   Vitals:   12/13/24 0556 12/13/24 0850  BP: 118/73   Pulse: 86   Resp: 18   Temp: 98.8 F (37.1 C)   SpO2: 94% 95%   Vitals:   12/12/24 2053 12/12/24 2104 12/13/24 0556 12/13/24 0850  BP: 102/66  118/73   Pulse: (!) 103  86   Resp: 19  18   Temp: 98.8 F (37.1 C)  98.8 F (37.1 C)   TempSrc: Oral  Oral   SpO2: 92% 95% 94% 95%  Weight:      Height:        General exam: Appears calm and comfortable.   The results of significant diagnostics from this hospitalization (including imaging, microbiology, ancillary and laboratory) are listed below for reference.     Procedures and Diagnostic Studies:   DG Ribs Unilateral W/Chest Left Result  Date: 12/08/2024 CLINICAL DATA:  pain EXAM: LEFT RIBS AND CHEST - 3+ VIEW COMPARISON:  December 21, 2023, June 19, 2022 FINDINGS: The cardiomediastinal silhouette is unchanged in contour.Atherosclerotic calcifications no pleural effusion. No pneumothorax. Patchy platelike opacities. There is a mildly displaced fracture of the LEFT posterolateral eighth rib and a possible nondisplaced fracture of the LEFT posterolateral ninth rib. IMPRESSION: 1. Mildly  displaced fracture of the LEFT posterolateral eighth rib and possible nondisplaced fracture of the LEFT posterolateral ninth rib. 2. Patchy platelike opacities likely reflect atelectasis. Electronically Signed   By: Corean Salter M.D.   On: 12/08/2024 09:35     Labs:   Basic Metabolic Panel: Recent Labs  Lab 12/08/24 0859 12/12/24 1045 12/13/24 0416  NA 138 128* 134*  K 4.8 3.7 4.4  CL 97* 90* 96*  CO2 24 23 30   GLUCOSE 96 96 81  BUN 17 31* 22  CREATININE 0.64 1.20* 0.83  CALCIUM  10.0 10.2 10.3   GFR Estimated Creatinine Clearance: 63.3 mL/min (by C-G formula based on SCr of 0.83 mg/dL). Liver Function Tests: No results for input(s): AST, ALT, ALKPHOS, BILITOT, PROT, ALBUMIN in the last 168 hours. No results for input(s): LIPASE, AMYLASE in the last 168 hours. No results for input(s): AMMONIA in the last 168 hours. Coagulation profile No results for input(s): INR, PROTIME in the last 168 hours.  CBC: Recent Labs  Lab 12/08/24 0859 12/12/24 0416 12/12/24 1045 12/13/24 0416  WBC 12.1* 8.8 10.0 7.8  NEUTROABS 9.9*  --   --   --   HGB 15.6* 14.2 13.8 13.5  HCT 47.2* 44.2 42.8 41.5  MCV 99.4 101.4* 100.9* 101.2*  PLT 219 193 197 174   Cardiac Enzymes: No results for input(s): CKTOTAL, CKMB, CKMBINDEX, TROPONINI in the last 168 hours. BNP: Invalid input(s): POCBNP CBG: Recent Labs  Lab 12/10/24 0719  GLUCAP 143*   D-Dimer Recent Labs    12/12/24 1045  DDIMER 0.74*   Hgb A1c No results for input(s): HGBA1C in the last 72 hours. Lipid Profile No results for input(s): CHOL, HDL, LDLCALC, TRIG, CHOLHDL, LDLDIRECT in the last 72 hours. Thyroid function studies No results for input(s): TSH, T4TOTAL, T3FREE, THYROIDAB in the last 72 hours.  Invalid input(s): FREET3 Anemia work up No results for input(s): VITAMINB12, FOLATE, FERRITIN, TIBC, IRON, RETICCTPCT in the last 72  hours. Microbiology No results found for this or any previous visit (from the past 240 hours).   Discharge Instructions:   Discharge Instructions     Ambulatory referral to Physical Therapy   Complete by: As directed    Diet - low sodium heart healthy   Complete by: As directed    Discharge instructions   Complete by: As directed    Continue using incentive spirometry-- as your ribs heal I suspect your O2 will be able to be weaned to off   Increase activity slowly   Complete by: As directed    No wound care   Complete by: As directed       Allergies as of 12/13/2024   No Known Allergies      Medication List     PAUSE taking these medications    hydrochlorothiazide  12.5 MG tablet Wait to take this until your doctor or other care provider tells you to start again. Commonly known as: HYDRODIURIL  Take 12.5 mg by mouth every morning.   losartan  50 MG tablet Wait to take this until your doctor or other care provider tells you to start again.  Commonly known as: COZAAR  Take 50 mg by mouth daily.   potassium chloride  SA 20 MEQ tablet Wait to take this until your doctor or other care provider tells you to start again. Commonly known as: KLOR-CON  M Take 1 tablet (20 mEq total) by mouth daily.       STOP taking these medications    HYDROcodone -acetaminophen  5-325 MG tablet Commonly known as: NORCO/VICODIN       TAKE these medications    acetaminophen  500 MG tablet Commonly known as: TYLENOL  Take 2 tablets (1,000 mg total) by mouth 3 (three) times daily for 10 days.   Anoro Ellipta 62.5-25 MCG/ACT Aepb Generic drug: umeclidinium-vilanterol Inhale 1 puff into the lungs daily.   atorvastatin  40 MG tablet Commonly known as: LIPITOR Take 40 mg by mouth at bedtime.   Cholecalciferol 50 MCG (2000 UT) Caps Take 2,000 Units by mouth.   GOODYS BODY PAIN PO Take 1 packet by mouth as needed (pain).   lidocaine  5 % Commonly known as: LIDODERM  Place 1 patch onto  the skin daily. Remove & Discard patch within 12 hours or as directed by MD Start taking on: December 14, 2024   meloxicam 7.5 MG tablet Commonly known as: MOBIC Take 7.5 mg by mouth daily as needed.   nicotine 21 mg/24hr patch Commonly known as: NICODERM CQ - dosed in mg/24 hours Place 21 mg onto the skin daily.   ondansetron  4 MG tablet Commonly known as: ZOFRAN  Take 1 tablet (4 mg total) by mouth every 8 (eight) hours as needed for nausea or vomiting.   oxyCODONE  5 MG immediate release tablet Commonly known as: Oxy IR/ROXICODONE  Take 1-2 tablets (5-10 mg total) by mouth every 4 (four) hours as needed for moderate pain (pain score 4-6) or severe pain (pain score 7-10).   traZODone  50 MG tablet Commonly known as: DESYREL  Take 75 mg by mouth at bedtime.   Ventolin  HFA 108 (90 Base) MCG/ACT inhaler Generic drug: albuterol  Inhale 2 puffs into the lungs every 6 (six) hours as needed.               Durable Medical Equipment  (From admission, onward)           Start     Ordered   12/13/24 1046  For home use only DME oxygen  Once       Question Answer Comment  Length of Need 6 Months   Mode or (Route) Nasal cannula   Liters per Minute 3   Frequency Continuous (stationary and portable oxygen unit needed)   Oxygen delivery system: Gas      12/13/24 1046            Follow-up Information     [facility] Personal Follow up in 1 week(s).   Why: BP check Contact information: [address] [place] [address] [phone]                  Time coordinating discharge: 45 minutes  Signed:  Harlene RAYMOND Bowl DO  [facility] 12/13/2024, 11:03 AM      "
